# Patient Record
Sex: Female | Born: 1990 | Hispanic: No | Marital: Married | Smoking: Never smoker
Health system: Southern US, Community
[De-identification: ages and names within clinical notes are randomized; demographics above are authoritative.]

## PROBLEM LIST (undated history)

## (undated) ENCOUNTER — Inpatient Hospital Stay (HOSPITAL_COMMUNITY): Payer: Self-pay

## (undated) DIAGNOSIS — Z98891 History of uterine scar from previous surgery: Secondary | ICD-10-CM

## (undated) DIAGNOSIS — Z789 Other specified health status: Secondary | ICD-10-CM

## (undated) DIAGNOSIS — O321XX0 Maternal care for breech presentation, not applicable or unspecified: Principal | ICD-10-CM

## (undated) HISTORY — PX: NO PAST SURGERIES: SHX2092

---

## 2011-11-06 NOTE — L&D Delivery Note (Signed)
Delivery Note At 1:53 PM a viable and healthy female was delivered via spontaneous vaginal delivery (Presentation: ROA).  APGAR: 7,8 ; weight 8lb 5oz .   Placenta status: intact , .  Cord:  with the following complications: none.  Cord pH: n/a  Anesthesia:  None  Episiotomy: None  Lacerations: 1st degree, no repair needed Suture Repair: n/a Est. Blood Loss (mL): 400  Mom to postpartum.  Baby to nursery-stable.  [facility] 02/23/2012, 2:21 PM

## 2012-02-23 ENCOUNTER — Inpatient Hospital Stay (HOSPITAL_COMMUNITY)
Admission: EM | Admit: 2012-02-23 | Discharge: 2012-02-25 | DRG: 775 | Disposition: A | Discharge status: Home / Self Care | Payer: Self-pay | Attending: Obstetrics & Gynecology | Admitting: Obstetrics & Gynecology

## 2012-02-23 ENCOUNTER — Encounter (HOSPITAL_COMMUNITY): Payer: Self-pay

## 2012-02-23 DIAGNOSIS — O0930 Supervision of pregnancy with insufficient antenatal care, unspecified trimester: Secondary | ICD-10-CM

## 2012-02-23 DIAGNOSIS — IMO0001 Reserved for inherently not codable concepts without codable children: Secondary | ICD-10-CM

## 2012-02-23 DIAGNOSIS — O2650 Maternal hypotension syndrome, unspecified trimester: Principal | ICD-10-CM | POA: Diagnosis not present

## 2012-02-23 LAB — URINALYSIS, ROUTINE W REFLEX MICROSCOPIC
Glucose, UA: NEGATIVE mg/dL
Ketones, ur: NEGATIVE mg/dL
Nitrite: NEGATIVE
pH: 7 (ref 5.0–8.0)

## 2012-02-23 LAB — URINE MICROSCOPIC-ADD ON

## 2012-02-23 LAB — DIFFERENTIAL
Basophils Absolute: 0 10*3/uL (ref 0.0–0.1)
Basophils Relative: 0 % (ref 0–1)
Eosinophils Absolute: 0.4 10*3/uL (ref 0.0–0.7)
Monocytes Absolute: 0.4 10*3/uL (ref 0.1–1.0)
Monocytes Relative: 4 % (ref 3–12)
Neutro Abs: 8 10*3/uL — ABNORMAL HIGH (ref 1.7–7.7)
Neutrophils Relative %: 77 % (ref 43–77)

## 2012-02-23 LAB — TYPE AND SCREEN: Antibody Screen: NEGATIVE

## 2012-02-23 LAB — POCT I-STAT, CHEM 8
HCT: 32 % — ABNORMAL LOW (ref 36.0–46.0)
Hemoglobin: 10.9 g/dL — ABNORMAL LOW (ref 12.0–15.0)
Potassium: 3.7 mEq/L (ref 3.5–5.1)
Sodium: 138 mEq/L (ref 135–145)

## 2012-02-23 LAB — CBC
HCT: 29.5 % — ABNORMAL LOW (ref 36.0–46.0)
Hemoglobin: 9.5 g/dL — ABNORMAL LOW (ref 12.0–15.0)
MCHC: 32.2 g/dL (ref 30.0–36.0)
RBC: 3.71 MIL/uL — ABNORMAL LOW (ref 3.87–5.11)
WBC: 10.3 10*3/uL (ref 4.0–10.5)

## 2012-02-23 LAB — RAPID URINE DRUG SCREEN, HOSP PERFORMED
Barbiturates: NOT DETECTED
Benzodiazepines: NOT DETECTED
Cocaine: NOT DETECTED
Tetrahydrocannabinol: NOT DETECTED

## 2012-02-23 LAB — HEPATITIS B SURFACE ANTIGEN: Hepatitis B Surface Ag: NEGATIVE

## 2012-02-23 LAB — ABO/RH: ABO/RH(D): O POS

## 2012-02-23 LAB — RPR: RPR Ser Ql: NONREACTIVE

## 2012-02-23 LAB — OB RESULTS CONSOLE HIV ANTIBODY (ROUTINE TESTING): HIV: NONREACTIVE

## 2012-02-23 MED ORDER — LANOLIN HYDROUS EX OINT: TOPICAL_OINTMENT | CUTANEOUS | Status: DC | PRN

## 2012-02-23 MED ORDER — CITRIC ACID-SODIUM CITRATE 334-500 MG/5ML PO SOLN: 30.0000 mL | ORAL | Status: DC | PRN

## 2012-02-23 MED ORDER — PRENATAL MULTIVITAMIN CH
1.0000 | ORAL_TABLET | Freq: Every day | ORAL | Status: DC
  Administered 2012-02-24 – 2012-02-25 (×2): 1 via ORAL
  Filled 2012-02-23 (×3): qty 1

## 2012-02-23 MED ORDER — DIBUCAINE 1 % RE OINT: 1.0000 "application " | TOPICAL_OINTMENT | RECTAL | Status: DC | PRN

## 2012-02-23 MED ORDER — DIPHENHYDRAMINE HCL 25 MG PO CAPS: 25.0000 mg | ORAL_CAPSULE | Freq: Four times a day (QID) | ORAL | Status: DC | PRN

## 2012-02-23 MED ORDER — LIDOCAINE HCL (PF) 1 % IJ SOLN
30.0000 mL | INTRAMUSCULAR | Status: DC | PRN
  Filled 2012-02-23: qty 30

## 2012-02-23 MED ORDER — IBUPROFEN 600 MG PO TABS
600.0000 mg | ORAL_TABLET | Freq: Four times a day (QID) | ORAL | Status: DC
  Administered 2012-02-23 – 2012-02-25 (×7): 600 mg via ORAL
  Filled 2012-02-23 (×6): qty 1

## 2012-02-23 MED ORDER — TERBUTALINE SULFATE 1 MG/ML IJ SOLN: 0.5000 mg | Freq: Once | INTRAMUSCULAR | Status: DC

## 2012-02-23 MED ORDER — ONDANSETRON HCL 4 MG/2ML IJ SOLN: 4.0000 mg | Freq: Four times a day (QID) | INTRAMUSCULAR | Status: DC | PRN

## 2012-02-23 MED ORDER — IBUPROFEN 600 MG PO TABS
600.0000 mg | ORAL_TABLET | Freq: Four times a day (QID) | ORAL | Status: DC | PRN
  Administered 2012-02-23: 600 mg via ORAL
  Filled 2012-02-23: qty 1

## 2012-02-23 MED ORDER — OXYCODONE-ACETAMINOPHEN 5-325 MG PO TABS
1.0000 | ORAL_TABLET | ORAL | Status: DC | PRN
  Administered 2012-02-24 (×2): 1 via ORAL
  Filled 2012-02-23 (×2): qty 1

## 2012-02-23 MED ORDER — ACETAMINOPHEN 325 MG PO TABS: 650.0000 mg | ORAL_TABLET | ORAL | Status: DC | PRN

## 2012-02-23 MED ORDER — FLEET ENEMA 7-19 GM/118ML RE ENEM: 1.0000 | ENEMA | RECTAL | Status: DC | PRN

## 2012-02-23 MED ORDER — TETANUS-DIPHTH-ACELL PERTUSSIS 5-2.5-18.5 LF-MCG/0.5 IM SUSP: 0.5000 mL | Freq: Once | INTRAMUSCULAR | Status: DC

## 2012-02-23 MED ORDER — BENZOCAINE-MENTHOL 20-0.5 % EX AERO
1.0000 "application " | INHALATION_SPRAY | CUTANEOUS | Status: DC | PRN
  Administered 2012-02-24: 1 via TOPICAL

## 2012-02-23 MED ORDER — LACTATED RINGERS IV SOLN: 500.0000 mL | INTRAVENOUS | Status: DC | PRN

## 2012-02-23 MED ORDER — OXYTOCIN BOLUS FROM INFUSION
500.0000 mL | Freq: Once | INTRAVENOUS | Status: DC
  Filled 2012-02-23: qty 1000
  Filled 2012-02-23: qty 500

## 2012-02-23 MED ORDER — SENNOSIDES-DOCUSATE SODIUM 8.6-50 MG PO TABS
2.0000 | ORAL_TABLET | Freq: Every day | ORAL | Status: DC
  Administered 2012-02-23 – 2012-02-24 (×2): 2 via ORAL

## 2012-02-23 MED ORDER — OXYTOCIN 20 UNITS IN LACTATED RINGERS INFUSION - SIMPLE
125.0000 mL/h | Freq: Once | INTRAVENOUS | Status: AC
  Administered 2012-02-23: 500 mL/h via INTRAVENOUS

## 2012-02-23 MED ORDER — ONDANSETRON HCL 4 MG/2ML IJ SOLN: 4.0000 mg | INTRAMUSCULAR | Status: DC | PRN

## 2012-02-23 MED ORDER — WITCH HAZEL-GLYCERIN EX PADS: 1.0000 "application " | MEDICATED_PAD | CUTANEOUS | Status: DC | PRN

## 2012-02-23 MED ORDER — ZOLPIDEM TARTRATE 5 MG PO TABS: 5.0000 mg | ORAL_TABLET | Freq: Every evening | ORAL | Status: DC | PRN

## 2012-02-23 MED ORDER — SIMETHICONE 80 MG PO CHEW: 80.0000 mg | CHEWABLE_TABLET | ORAL | Status: DC | PRN

## 2012-02-23 MED ORDER — ONDANSETRON HCL 4 MG PO TABS: 4.0000 mg | ORAL_TABLET | ORAL | Status: DC | PRN

## 2012-02-23 MED ORDER — LACTATED RINGERS IV SOLN
INTRAVENOUS | Status: DC
  Administered 2012-02-23: 11:00:00 via INTRAVENOUS

## 2012-02-23 MED ORDER — OXYCODONE-ACETAMINOPHEN 5-325 MG PO TABS
1.0000 | ORAL_TABLET | ORAL | Status: DC | PRN
Start: 2012-02-23 — End: 2012-02-23
  Administered 2012-02-23: 1 via ORAL
  Filled 2012-02-23: qty 1

## 2012-02-23 MED ORDER — SODIUM CHLORIDE 0.9 % IV SOLN
2.0000 g | Freq: Four times a day (QID) | INTRAVENOUS | Status: DC
  Administered 2012-02-23: 2 g via INTRAVENOUS
  Filled 2012-02-23 (×3): qty 2000

## 2012-02-23 NOTE — H&P (Signed)
[name] [name] is a 21 y.o. female presenting for active labor.  Pt transferred from [facility] in active labor.  Contractions began at 0900 today.  Denies vaginal bleeding or leaking of fluid.  Reports no prenatal care and no problems during pregnancy.  Significant other unable to take her to see provider due to his work schedule.  Uncertain LMP.  Reports an uncomplicated first delivery and pregnancy in [place].  Maternal Medical History:  Reason for admission: Reason for admission: contractions.  Contractions: Onset was 3-5 hours ago.   Frequency: regular.    Fetal activity: Perceived fetal activity is normal.   Last perceived fetal movement was within the past hour.    Prenatal complications: no prenatal complications   OB History    Grav Para Term Preterm Abortions TAB SAB Ect Mult Living   2 1 0       1     History reviewed. No pertinent past medical history. History reviewed. No pertinent past surgical history. Family History: family history is not on file. Social History:  reports that she has never smoked. She does not have any smokeless tobacco history on file. She reports that she does not drink alcohol or use illicit drugs.  Review of Systems  Gastrointestinal: Positive for abdominal pain.  All other systems reviewed and are negative.    Dilation: 8 Effacement (%): 0 Exam by:: Katie, RN rapid OB nurse Blood pressure 105/56, pulse 106, temperature 97.9 F (36.6 C), temperature source Oral, resp. rate 18, height 5\' 3"  (1.6 m), weight 70.308 kg (155 lb), SpO2 98.00%. Maternal Exam:  Uterine Assessment: Contraction strength is firm.  Contraction frequency is regular.   Abdomen: Fundal height is 36.   Estimated fetal weight is 6-6.5.   Fetal presentation: no presenting part  Introitus: Normal vulva. Normal vagina.  Vaginal discharge: mucusy.  Pelvis: adequate for delivery.      Physical Exam  Constitutional: She is oriented to person, place, and time. She  appears well-developed and well-nourished. She appears distressed (appears uncomfortable with contractions).  HENT:  Head: Normocephalic.  Neck: Normal range of motion. Neck supple.  Cardiovascular: Normal rate, regular rhythm and normal heart sounds.   Respiratory: Effort normal and breath sounds normal.  GI: Soft. There is no tenderness.  Genitourinary: No bleeding around the vagina. Vaginal discharge: mucusy.  Neurological: She is alert and oriented to person, place, and time.  Skin: Skin is warm and dry.    Prenatal labs: ABO, Rh:   Antibody:   Rubella:   RPR:    HBsAg:    HIV:    GBS:     Assessment/Plan: Active Labor No Prenatal Care GBS unknown Category I FHR  Plan: Admit to birthing suites Ampicillin 2 gm IV Obtain prenatal panel + UDS Anticipate NSVD  [facility] 02/23/2012, 11:40 AM

## 2012-02-23 NOTE — ED Notes (Signed)
Pt sts she started having contractions on Friday, she did have some fluid leakage earlier before coming in. Pt sts this is her 2nd child. Pt does not speak very good [place].

## 2012-02-23 NOTE — Progress Notes (Signed)
Pt while in the bathroom, stood up from toilet and then was eased to the floor by this RN as the patient passed out.  Patient did not appear to hit any part of her body.  Husband came immediately after being asked to hit emergency button.  Amonia used to help patient revive.  Patient then transferred to a wheel chair.  Vital signs were retaken, no significant difference.  Juice given and patient put in bed supine with husband at bedside.  No increase in bleeding - which remains small in amount.  No further change in mentation, neuro status, vs, etc.

## 2012-02-23 NOTE — ED Notes (Signed)
[facility] at the bedside. Report called to Elnita Maxwell, RN at [facility] hospital. Accepting physician Dr. Marice Potter.

## 2012-02-23 NOTE — ED Notes (Signed)
Pharmacy unable to send medication at the time, Katie RN with OB rapid response at the bedside. [facility]

## 2012-02-23 NOTE — ED Provider Notes (Signed)
History     CSN: [id number]  Arrival date & time 02/23/12  1041   First MD Initiated Contact with Patient 02/23/12 1056      Chief Complaint  Patient presents with  . Contractions    (Consider location/radiation/quality/duration/timing/severity/associated sxs/prior treatment) HPI  [name] [name] G2P1 presents in active labor. Pt recently moved here. Husband states she is without OBGYN care throughout pregnancy. Unk how far along she is. She reports small trickle of fluid and abdominal pain/contractions 5 minutes apart. Feels like she wants to push. Denies bleeding. Denies trauma.   Language barrier. Interpreted through husband temporarily   History reviewed. No pertinent past medical history.  History reviewed. No pertinent past surgical history.  No family history on file.  History  Substance Use Topics  . Smoking status: Never Smoker   . Smokeless tobacco: Not on file  . Alcohol Use: No    OB History    Grav Para Term Preterm Abortions TAB SAB Ect Mult Living   2 1 0       1      Review of Systems  All other systems reviewed and are negative.   except as noted HPI   Allergies  Review of patient's allergies indicates no known allergies.  Home Medications  No current outpatient prescriptions on file.  BP 105/56  Pulse 106  Temp(Src) 97.9 F (36.6 C) (Oral)  Resp 18  Ht 5\' 3"  (1.6 m)  Wt 155 lb (70.308 kg)  BMI 27.46 kg/m2  SpO2 98%  Physical Exam  Nursing note and vitals reviewed. Constitutional: She is oriented to person, place, and time. She appears well-developed.  HENT:  Head: Atraumatic.  Mouth/Throat: Oropharynx is clear and moist.  Eyes: Conjunctivae and EOM are normal. Pupils are equal, round, and reactive to light.  Neck: Normal range of motion. Neck supple.  Cardiovascular: Normal rate, regular rhythm, normal heart sounds and intact distal pulses.   Pulmonary/Chest: Effort normal and breath sounds normal. No respiratory distress. She has no  wheezes. She has no rales.  Abdominal: Soft. She exhibits no distension. There is no tenderness. There is no rebound and no guarding.       Gravid uterus FHR 150s Active contractions noted on monitor  Genitourinary:       8cm dilated 0 station  (bulging bag per OB rapid response nurse)    Musculoskeletal: Normal range of motion.  Neurological: She is alert and oriented to person, place, and time.  Skin: Skin is warm and dry. No rash noted.  Psychiatric: She has a normal mood and affect.    ED Course  Procedures (including critical care time)  Labs Reviewed  POCT I-STAT, CHEM 8 - Abnormal; Notable for the following:    BUN 5 (*)    Calcium, Ion 1.09 (*)    Hemoglobin 10.9 (*)    HCT 32.0 (*)    All other components within normal limits  CBC  RPR  URINALYSIS, ROUTINE W REFLEX MICROSCOPIC  HEPATITIS B SURFACE ANTIGEN  RUBELLA SCREEN  DIFFERENTIAL  TYPE AND SCREEN  RAPID HIV SCREEN ([facility])  URINE RAPID DRUG SCREEN (HOSP PERFORMED)   No results found.   1. Active labor     MDM  Active labor at 0 station with bulging bag. OB rapid response nurse d/w Dr. Marice Potter. 0.5cm terbutaline subcut ordered. [facility] on standby for transfer to [facility].         Forbes Cellar, MD 02/23/12 [phone]

## 2012-02-24 MED ORDER — BENZOCAINE-MENTHOL 20-0.5 % EX AERO
INHALATION_SPRAY | CUTANEOUS | Status: AC
  Filled 2012-02-24: qty 56

## 2012-02-24 NOTE — Progress Notes (Signed)
Post Partum Day 1 Subjective: no complaints, up ad lib, voiding and tolerating PO; breastfeeding, declines family planning.    Objective: Blood pressure 98/62, pulse 60, temperature 98.6 F (37 C), temperature source Oral, resp. rate 18, height 5\' 3"  (1.6 m), weight 70.308 kg (155 lb), SpO2 98.00%, unknown if currently breastfeeding.  Physical Exam:  General: alert, cooperative and appears stated age Lochia: appropriate Uterine Fundus: firm Incision: n/a DVT Evaluation: No evidence of DVT seen on physical exam. Negative Homan's sign.   Basename 02/23/12 1139 02/23/12 1107  HGB 9.5* 10.9*  HCT 29.5* 32.0*    Assessment/Plan: Plan for discharge tomorrow and Contraception declines.   LOS: 1 day   [facility] 02/24/2012, 7:21 AM

## 2012-02-24 NOTE — Clinical Social Work Maternal (Signed)
Clinical Social Work Department PSYCHOSOCIAL ASSESSMENT - MATERNAL/CHILD 02/24/2012  Patient:  [name] [name]  Account Number:  [id number]  Admit Date:  02/23/2012  Childs Name:   [name] [name]    Clinical Social Worker:  Lylee Corrow, LCSW   Date/Time:  02/24/2012 03:00 PM  Date Referred:  02/24/2012   Referral source  Central Nursery     Referred reason  LPNC   Other referral source:    I:  FAMILY / HOME ENVIRONMENT Child's legal guardian:  PARENT  Guardian - Name Guardian - Age Guardian - Address  [name] [name] 20 [address], [place], [place] [address]   Other household support members/support persons Name Relationship DOB  [name] [name] FATHER    Other support:   Pt and FOB live with his cousin and wife.    II  PSYCHOSOCIAL DATA Information Source:  Family Interview  Financial and Community Resources Employment:   Pt not employeed.   Financial resources:  Self Pay If Medicaid - County:    School / Grade:   Maternity Care Coordinator / Child Services Coordination / Early Interventions:  Cultural issues impacting care:   Mother and father of baby are from near [place].  Mother speaks some English, but relies on father who is fluent.    III  STRENGTHS Strengths  Adequate Resources  Home prepared for Child (including basic supplies)  Supportive family/friends   Strength comment:  FOB is employeed and has been working at obtaining necessary supplies and support for his family.  He appeared very attentive to both the baby and MOB.   IV  RISK FACTORS AND CURRENT PROBLEMS Current Problem:  None   Risk Factor & Current Problem Patient Issue Family Issue Risk Factor / Current Problem Comment   N N     V  SOCIAL WORK ASSESSMENT Received referral due to no prenatal care due to insurance. Pt and FOB were present during visit.  They are from near [place].  She speaks some English, but relies FOB who is fluent.  Informed them of drug screen due to prenatal care issues  and that it was negative.  MEC pending.  They said they understood.    They appear to be adjusting to their move to [place].  He had the car seat in the room and stated they have diapers, food, etc. for the baby.  His cousin and wife currently live with them and have been supportive.  He answered questions appropriately, but was hesitant to engage in lengthy conversation.  They will follow up with DSS for Medicaid.  He said they would not be eligible for WIC or Food Stamps.      VI SOCIAL WORK PLAN Social Work Plan  No Further Intervention Required / No Barriers to Discharge   Type of pt/family education:   Medicaid application provided to family.  Informed them to go to DSS tomorrow to apply.   If child protective services report - county:   If child protective services report - date:   Information/referral to community resources comment:   Other social work plan:      

## 2012-02-25 MED ORDER — IBUPROFEN 600 MG PO TABS: 600.0000 mg | ORAL_TABLET | Freq: Four times a day (QID) | ORAL | Status: AC

## 2012-02-25 NOTE — Progress Notes (Signed)
Attempted to use [name] [name] Interpreter to assist with discharge instructions, since [place] proficiency of both patient and her husband was difficult to determine during the morning.  Husband insisted that he understood everything and that he would interpret for his wife.  He stated that they spoke "[place]" (unsure of spelling), a language of [place].  Pt nodded her head in agreement with husband's assertion that he would interpret.  Provided discharge instructions per usual due to husband's insistence.

## 2012-02-25 NOTE — Discharge Instructions (Signed)
Vaginal Delivery Care After  Change your pad on each trip to the bathroom.   Wipe gently with toilet paper during your hospital stay. Always wipe from front to back. A spray bottle with warm tap water could also be used or a towelette if available.   Place your soiled pad and toilet paper in a bathroom wastebasket with a plastic bag liner.   During your hospital stay, save any clots. If you pass a clot while on the toilet, do not flush it. Also, if your vaginal flow seems excessive to you, notify nursing personnel.   The first time you get out of bed after delivery, wait for assistance from a nurse. Do not get up alone at any time if you feel weak or dizzy.   Bend and extend your ankles forcefully so that you feel the calves of your legs get hard. Do this 6 times every hour when you are in bed and awake.   Do not sit with one foot under you, dangle your legs over the edge of the bed, or maintain a position that hinders the circulation in your legs.   Many women experience after pains for 2 to 3 days after delivery. These after pains are mild uterine contractions. Ask the nurse for a pain medication if you need something for this. Sometimes breastfeeding stimulates after pains; if you find this to be true, ask for the medication  -  hour before the next feeding.   For you and your infant's protection, do not go beyond the door(s) of the obstetric unit. Do not carry your baby in your arms in the hallway. When taking your baby to and from your room, put your baby in the bassinet and push the bassinet.   Mothers may have their babies in their room as much as they desire.  Document Released: 10/19/2000 Document Revised: 10/11/2011 Document Reviewed: 09/19/2007 ExitCare Patient Information 2012 [employer]. 

## 2012-02-25 NOTE — Progress Notes (Signed)
UR chart review completed.  

## 2012-02-25 NOTE — Discharge Summary (Signed)
Obstetric Discharge Summary Reason for Admission: onset of labor Prenatal Procedures: none Intrapartum Procedures: spontaneous vaginal delivery Postpartum Procedures: none Complications-Operative and Postpartum: none Hemoglobin  Date Value Range Status  02/23/2012 9.5* 12.0-15.0 (g/dL) Final     HCT  Date Value Range Status  02/23/2012 29.5* 36.0-46.0 (%) Final    Physical Exam:  General: alert, cooperative, appears stated age and no distress Lochia: appropriate Uterine Fundus: firm Incision: DVT Evaluation: No evidence of DVT seen on physical exam. Negative Homan's sign. No cords or calf tenderness. No significant calf/ankle edema.  Discharge Diagnoses: Term Pregnancy-delivered  Discharge Information: Date: 02/25/2012 Activity: pelvic rest Diet: routine Medications: Ibuprofen Condition: stable and improved Instructions: refer to practice specific booklet Discharge to: home Follow-up Information    Follow up with [facility] [place] in 6 weeks.   Contact information:   [address] [employer] [place] [address]          Newborn Data: Live born female  Birth Weight: 8 lb 5 oz (3771 g) APGAR: 7, 8  Home with mother.  [name] [name] [name] [name] 02/25/2012, 6:33 AM

## 2012-02-25 NOTE — Progress Notes (Signed)
Post Partum Day 2 Subjective: no complaints, up ad lib, voiding, tolerating PO and + flatus  Objective: Blood pressure 100/58, pulse 65, temperature 98 F (36.7 C), temperature source Oral, resp. rate 18, height 5\' 3"  (1.6 m), weight 155 lb (70.308 kg), SpO2 98.00%, unknown if currently breastfeeding.  Physical Exam:  General: alert, cooperative, appears stated age and no distress Lochia: appropriate Uterine Fundus: firm Incision: n/a DVT Evaluation: No evidence of DVT seen on physical exam. Negative Homan's sign. No cords or calf tenderness. No significant calf/ankle edema. Positive Homan's sign.   Basename 02/23/12 1139 02/23/12 1107  HGB 9.5* 10.9*  HCT 29.5* 32.0*    Assessment/Plan: Discharge home and Contraception declines contraception   LOS: 2 days   [name] [name] 02/25/2012, 6:29 AM

## 2013-10-25 ENCOUNTER — Emergency Department (HOSPITAL_COMMUNITY): Payer: Medicaid Other

## 2013-10-25 ENCOUNTER — Encounter (HOSPITAL_COMMUNITY): Payer: Self-pay | Admitting: Emergency Medicine

## 2013-10-25 ENCOUNTER — Emergency Department (HOSPITAL_COMMUNITY)
Admission: EM | Admit: 2013-10-25 | Discharge: 2013-10-25 | Disposition: A | Discharge status: Home / Self Care | Payer: Medicaid Other | Attending: Emergency Medicine | Admitting: Emergency Medicine

## 2013-10-25 DIAGNOSIS — N760 Acute vaginitis: Secondary | ICD-10-CM | POA: Insufficient documentation

## 2013-10-25 DIAGNOSIS — A499 Bacterial infection, unspecified: Secondary | ICD-10-CM | POA: Insufficient documentation

## 2013-10-25 DIAGNOSIS — B9689 Other specified bacterial agents as the cause of diseases classified elsewhere: Secondary | ICD-10-CM | POA: Insufficient documentation

## 2013-10-25 DIAGNOSIS — O2390 Unspecified genitourinary tract infection in pregnancy, unspecified trimester: Secondary | ICD-10-CM | POA: Insufficient documentation

## 2013-10-25 DIAGNOSIS — N390 Urinary tract infection, site not specified: Secondary | ICD-10-CM

## 2013-10-25 DIAGNOSIS — Z79899 Other long term (current) drug therapy: Secondary | ICD-10-CM | POA: Insufficient documentation

## 2013-10-25 DIAGNOSIS — O200 Threatened abortion: Secondary | ICD-10-CM | POA: Insufficient documentation

## 2013-10-25 DIAGNOSIS — A599 Trichomoniasis, unspecified: Secondary | ICD-10-CM | POA: Insufficient documentation

## 2013-10-25 LAB — URINE MICROSCOPIC-ADD ON

## 2013-10-25 LAB — URINALYSIS, ROUTINE W REFLEX MICROSCOPIC
Bilirubin Urine: NEGATIVE
Glucose, UA: NEGATIVE mg/dL
Ketones, ur: NEGATIVE mg/dL
Nitrite: NEGATIVE
Protein, ur: NEGATIVE mg/dL
Specific Gravity, Urine: 1.003 — ABNORMAL LOW (ref 1.005–1.030)
Urobilinogen, UA: 0.2 mg/dL (ref 0.0–1.0)
pH: 7 (ref 5.0–8.0)

## 2013-10-25 LAB — WET PREP, GENITAL
Trich, Wet Prep: NONE SEEN
Yeast Wet Prep HPF POC: NONE SEEN

## 2013-10-25 MED ORDER — CEPHALEXIN 500 MG PO CAPS: 500.0000 mg | ORAL_CAPSULE | Freq: Three times a day (TID) | ORAL | Status: DC

## 2013-10-25 MED ORDER — CEPHALEXIN 250 MG PO CAPS
250.0000 mg | ORAL_CAPSULE | Freq: Once | ORAL | Status: AC
  Administered 2013-10-25: 250 mg via ORAL
  Filled 2013-10-25: qty 1

## 2013-10-25 MED ORDER — METRONIDAZOLE 0.75 % VA GEL: 1.0000 | Freq: Two times a day (BID) | VAGINAL | Status: DC

## 2013-10-25 NOTE — ED Provider Notes (Signed)
CSN: [id number]     Arrival date & time 10/25/13  1725 History   First MD Initiated Contact with Patient 10/25/13 1733     Chief Complaint  Patient presents with  . Vaginal Bleeding   (Consider location/radiation/quality/duration/timing/severity/associated sxs/prior Treatment) HPI  Sister in law interprets  Patient is G3 P2 Ab0. She does not recall her last normal period but states she's about [redacted] weeks pregnant. She had a positive pregnancy test at the health department. She is waiting for her first prenatal visit. She's had some minor spotting during this pregnancy. She states at 8 AM she got up and she was fine. At 10 AM she got up out of bed and she had some bleeding. She's continued to have bleeding throughout the day. He cannot tell me if it's heavier or the same as her regular period. She denies any abdominal discomfort. She denies nausea, vomiting, fever. She has bilateral lower back pain. She states she had dysuria yesterday without frequency. She has had two normal full term pregnancies and deliveries.    OB will be [facility]  History reviewed. No pertinent past medical history. History reviewed. No pertinent past surgical history. History reviewed. No pertinent family history. History  Substance Use Topics  . Smoking status: Never Smoker   . Smokeless tobacco: Never Used  . Alcohol Use: No   Lives at home Lives with spouse Unemployed From [place]   [place] History   Grav Para Term Preterm Abortions TAB SAB Ect Mult Living   3 2 1       2      Review of Systems  All other systems reviewed and are negative.    Allergies  Review of patient's allergies indicates no known allergies.  Home Medications   Current Outpatient Rx  Name  Route  Sig  Dispense  Refill  . Prenatal Vit-Fe Fumarate-FA (MULTIVITAMIN-PRENATAL) 27-0.8 MG TABS tablet   Oral   Take 1 tablet by mouth daily at 12 noon.          BP 115/66  Pulse 97  Temp(Src) 97.6 F (36.4 C) (Oral)  Resp  14  SpO2 97%  Vital signs normal   Physical Exam  Nursing note and vitals reviewed. Constitutional: She is oriented to person, place, and time. She appears well-developed and well-nourished.  Non-toxic appearance. She does not appear ill. No distress.  HENT:  Head: Normocephalic and atraumatic.  Right Ear: External ear normal.  Left Ear: External ear normal.  Nose: Nose normal. No mucosal edema or rhinorrhea.  Mouth/Throat: Oropharynx is clear and moist and mucous membranes are normal. No dental abscesses or uvula swelling.  Eyes: Conjunctivae and EOM are normal. Pupils are equal, round, and reactive to light.  Neck: Normal range of motion and full passive range of motion without pain. Neck supple.  Cardiovascular: Normal rate, regular rhythm and normal heart sounds.  Exam reveals no gallop and no friction rub.   No murmur heard. Pulmonary/Chest: Effort normal and breath sounds normal. No respiratory distress. She has no wheezes. She has no rhonchi. She has no rales. She exhibits no tenderness and no crepitus.  Abdominal: Soft. Normal appearance and bowel sounds are normal. She exhibits no distension. There is tenderness. There is no rebound and no guarding.    Genitourinary:  Normal external genitalia. Small amount of dark blood in the vault. Her uterus does not feel enlarged. She's tender diffusely over her uterus and both adnexa are without masses.Cx feels closed.   Musculoskeletal: Normal  range of motion. She exhibits no edema and no tenderness.  Moves all extremities well.   Neurological: She is alert and oriented to person, place, and time. She has normal strength. No cranial nerve deficit.  Skin: Skin is warm, dry and intact. No rash noted. No erythema. No pallor.  Psychiatric: She has a normal mood and affect. Her speech is normal and behavior is normal. Her mood appears not anxious.    ED Course  Procedures (including critical care time)   Review of prior records shows she  was O+ in 2013.  Pt given results of her [place] and her labs. She is strongly advised to get her prenatal care started.   Labs Review Results for orders placed during the hospital encounter of 10/25/13  WET PREP, GENITAL      Result Value Range   Yeast Wet Prep HPF POC NONE SEEN  NONE SEEN   Trich, Wet Prep NONE SEEN  NONE SEEN   Clue Cells Wet Prep HPF POC FEW (*) NONE SEEN   WBC, Wet Prep HPF POC FEW (*) NONE SEEN  URINALYSIS, ROUTINE W REFLEX MICROSCOPIC      Result Value Range   Color, Urine YELLOW  YELLOW   APPearance CLOUDY (*) CLEAR   Specific Gravity, Urine 1.003 (*) 1.005 - 1.030   pH 7.0  5.0 - 8.0   Glucose, UA NEGATIVE  NEGATIVE mg/dL   Hgb urine dipstick LARGE (*) NEGATIVE   Bilirubin Urine NEGATIVE  NEGATIVE   Ketones, ur NEGATIVE  NEGATIVE mg/dL   Protein, ur NEGATIVE  NEGATIVE mg/dL   Urobilinogen, UA 0.2  0.0 - 1.0 mg/dL   Nitrite NEGATIVE  NEGATIVE   Leukocytes, UA LARGE (*) NEGATIVE  URINE MICROSCOPIC-ADD ON      Result Value Range   Squamous Epithelial / LPF RARE  RARE   WBC, UA 11-20  <3 WBC/hpf   RBC / HPF 3-6  <3 RBC/hpf   Bacteria, UA FEW (*) RARE   Urine-Other TRICHOMONAS PRESENT     Laboratory interpretation all normal except bacterial vaginosis, UTI   Imaging Review US Ob Limited  10/25/2013   CLINICAL DATA:  Vaginal bleeding.  EXAM: LIMITED OBSTETRIC ULTRASOUND  FINDINGS: Number of Fetuses: 1  Heart Rate:  165 bpm  Movement: Yes  Presentation: Cephalic  Placental Location: Anterior  Previa: No  Amniotic Fluid (Subjective):  Within normal limits.  BPD:  3.56cm 17w  0d  MATERNAL FINDINGS:  Cervix:  Appears closed.  Uterus/Adnexae:  No abnormality visualized.  IMPRESSION: Negative exam.  This exam is performed on an emergent basis and does not comprehensively evaluate fetal size, dating, or anatomy; follow-up complete OB US should be considered if further fetal assessment is warranted.   Electronically Signed   By: Drusilla Kanner M.D.   On: 10/25/2013  19:35    EKG Interpretation   None       MDM   1. Threatened miscarriage   2. UTI (urinary tract infection)   3. BV (bacterial vaginosis)   4. Trichomoniasis       Discharge Medication List as of 10/25/2013  8:44 PM    START taking these medications   Details  metroNIDAZOLE (METROGEL VAGINAL) 0.75 % vaginal gel Place 1 Applicatorful vaginally 2 (two) times daily., Starting 10/25/2013, Until Discontinued, Print    cephALEXin (KEFLEX) 500 MG capsule Take 1 capsule (500 mg total) by mouth 3 (three) times daily., Starting 10/25/2013, Until Discontinued, Print  Plan discharge  Devoria Albe, MD, Franz Dell, MD 10/26/13 [phone]

## 2013-10-25 NOTE — ED Notes (Signed)
Unable to obtain fetal heart tones. MD made aware.

## 2013-10-25 NOTE — ED Notes (Signed)
She c/o awakening with "a lot of blood in my bed".  She states she is ~[redacted]weeks gestation.  She states she is completely pain-free and in no distress.  She has an adult female with her who is acting as a capable Nurse, learning disability.  Pt. Is [place] and is healthy in appearance.

## 2013-10-26 ENCOUNTER — Encounter (HOSPITAL_COMMUNITY): Payer: Self-pay | Admitting: Emergency Medicine

## 2013-10-26 ENCOUNTER — Emergency Department (HOSPITAL_COMMUNITY)
Admission: EM | Admit: 2013-10-26 | Discharge: 2013-10-26 | Disposition: A | Discharge status: Home / Self Care | Payer: Medicaid Other | Attending: Emergency Medicine | Admitting: Emergency Medicine

## 2013-10-26 DIAGNOSIS — Z3490 Encounter for supervision of normal pregnancy, unspecified, unspecified trimester: Secondary | ICD-10-CM

## 2013-10-26 DIAGNOSIS — R300 Dysuria: Secondary | ICD-10-CM | POA: Insufficient documentation

## 2013-10-26 DIAGNOSIS — R109 Unspecified abdominal pain: Secondary | ICD-10-CM | POA: Insufficient documentation

## 2013-10-26 DIAGNOSIS — Z79899 Other long term (current) drug therapy: Secondary | ICD-10-CM | POA: Insufficient documentation

## 2013-10-26 DIAGNOSIS — R339 Retention of urine, unspecified: Secondary | ICD-10-CM | POA: Insufficient documentation

## 2013-10-26 DIAGNOSIS — O9989 Other specified diseases and conditions complicating pregnancy, childbirth and the puerperium: Secondary | ICD-10-CM | POA: Insufficient documentation

## 2013-10-26 LAB — URINE MICROSCOPIC-ADD ON

## 2013-10-26 LAB — URINALYSIS, ROUTINE W REFLEX MICROSCOPIC
Ketones, ur: NEGATIVE mg/dL
Nitrite: POSITIVE — AB
Protein, ur: NEGATIVE mg/dL

## 2013-10-26 LAB — GC/CHLAMYDIA PROBE AMP: GC Probe RNA: NEGATIVE

## 2013-10-26 NOTE — ED Notes (Signed)
Pt seen here yesterday for TAB, pt return for increase in pain and continued bleeding. Pt states she feels as if she has to urine but can not

## 2013-10-27 LAB — URINE CULTURE: Colony Count: >=100000

## 2013-10-28 NOTE — ED Provider Notes (Signed)
CSN: [id number]     Arrival date & time 10/26/13  0234 History   First MD Initiated Contact with Patient 10/26/13 [phone]     Chief Complaint  Patient presents with  . Urinary Retention  . Abdominal Pain   (Consider location/radiation/quality/duration/timing/severity/associated sxs/prior Treatment) HPI Comments: 22 yo female G3 P2 presents with urinary retention, currently [redacted] wks pregnant.  Pt has not had prenatal care at this point. Was seen in the ED recently for mild spotting and fup discussed.  Pt now is unable to urinate since earlier today.  No hx of similar.  Mild dysuria. No leg weakness, back surgery, back pain or numbness.   Patient is a 22 y.o. female presenting with abdominal pain. The history is provided by the patient and a relative.  Abdominal Pain Associated symptoms: dysuria   Associated symptoms: no chest pain, no chills, no fever, no shortness of breath, no vaginal discharge and no vomiting     History reviewed. No pertinent past medical history. History reviewed. No pertinent past surgical history. No family history on file. History  Substance Use Topics  . Smoking status: Never Smoker   . Smokeless tobacco: Never Used  . Alcohol Use: No   OB History   Grav Para Term Preterm Abortions TAB SAB Ect Mult Living   3 2 1       2      Review of Systems  Constitutional: Negative for fever and chills.  HENT: Negative for congestion.   Eyes: Negative for visual disturbance.  Respiratory: Negative for shortness of breath.   Cardiovascular: Negative for chest pain.  Gastrointestinal: Positive for abdominal pain. Negative for vomiting.  Genitourinary: Positive for dysuria and difficulty urinating. Negative for flank pain and vaginal discharge.  Musculoskeletal: Negative for back pain, neck pain and neck stiffness.  Skin: Negative for rash.  Neurological: Negative for light-headedness and headaches.    Allergies  Review of patient's allergies indicates no known  allergies.  Home Medications   Current Outpatient Rx  Name  Route  Sig  Dispense  Refill  . Prenatal Vit-Fe Fumarate-FA (MULTIVITAMIN-PRENATAL) 27-0.8 MG TABS tablet   Oral   Take 1 tablet by mouth daily at 12 noon.         . cephALEXin (KEFLEX) 500 MG capsule   Oral   Take 1 capsule (500 mg total) by mouth 3 (three) times daily.   21 capsule   0   . metroNIDAZOLE (METROGEL VAGINAL) 0.75 % vaginal gel   Vaginal   Place 1 Applicatorful vaginally 2 (two) times daily.   70 g   0    BP 125/73  Pulse 98  Temp(Src) 97.5 F (36.4 C) (Oral)  Resp 20  SpO2 100% Physical Exam  Nursing note and vitals reviewed. Constitutional: She is oriented to person, place, and time. She appears well-developed and well-nourished.  HENT:  Head: Normocephalic and atraumatic.  Eyes: Conjunctivae are normal. Right eye exhibits no discharge. Left eye exhibits no discharge.  Neck: Normal range of motion. Neck supple. No tracheal deviation present.  Cardiovascular: Normal rate and regular rhythm.   Pulmonary/Chest: Effort normal and breath sounds normal.  Abdominal: Soft. She exhibits no distension. There is tenderness (mild suprapubic). There is no guarding.  Musculoskeletal: She exhibits no edema.  Neurological: She is alert and oriented to person, place, and time. She has normal strength. No sensory deficit.  Reflex Scores:      Patellar reflexes are 2+ on the right side and 2+ on the  left side.      Achilles reflexes are 2+ on the right side and 2+ on the left side. Skin: Skin is warm. No rash noted.  Psychiatric: She has a normal mood and affect.    ED Course  Procedures (including critical care time) EMERGENCY DEPARTMENT [place] PREGNANCY "Study: Limited Ultrasound of the Pelvis for Pregnancy"  INDICATIONS:Pregnancy(required) and Vaginal bleeding Multiple views of the uterus and pelvic cavity were obtained in real-time with a multi-frequency probe.  APPROACH:Transabdominal   PERFORMED  BY: Myself  IMAGES ARCHIVED?: Yes  LIMITATIONS: none  PREGNANCY FREE FLUID: None   PREGNANCY FINDINGS: Intrauterine gestational sac noted and Fetal heart activity seen  INTERPRETATION: Viable intrauterine pregnancy  FETAL HEART RATE: 150s  Limited Ultrasound of bladder  Performed by Dr. Jodi Mourning Indication: to assess for urinary retention  Technique:  Low frequency probe utilized in two planes to assess bladder volume in real-time. Findings: Bladder volume > 500 cc Images were archived electronically  Labs Review Labs Reviewed  URINALYSIS, ROUTINE W REFLEX MICROSCOPIC - Abnormal; Notable for the following:    Specific Gravity, Urine 1.002 (*)    Hgb urine dipstick LARGE (*)    Nitrite POSITIVE (*)    Leukocytes, UA LARGE (*)    All other components within normal limits  URINE MICROSCOPIC-ADD ON   Imaging Review No results found.  EKG Interpretation   None       MDM   1. Urinary retention   2. Pregnancy    Neuro intact.   Pt on abx for UTI and vaginal infection.  No flank pain, well appearing, no fevers. Vag bleeding mild, similar to her previous visit. O pos blood, reviewed previous note Bedside US showed significant urinary retention > 500 ml and bilateral hydronephrosis.  Pregnancy viable on [place].   ? More than expected for normal Pregnancy vs UTI/ dysuria. Spoke with urology on call, agreed with concerns and rec foley and will follow closely in office.   Pt on keflex for UTI, will continue.  Results and differential diagnosis were discussed with the patient. Close follow up outpatient was discussed, patient comfortable with the plan.   Diagnosis: above   Enid Skeens, MD 10/28/13 [phone]

## 2013-10-30 ENCOUNTER — Emergency Department (HOSPITAL_COMMUNITY)
Admission: EM | Admit: 2013-10-30 | Discharge: 2013-10-30 | Disposition: A | Discharge status: Home / Self Care | Payer: Medicaid Other | Attending: Emergency Medicine | Admitting: Emergency Medicine

## 2013-10-30 ENCOUNTER — Encounter (HOSPITAL_COMMUNITY): Payer: Self-pay | Admitting: Emergency Medicine

## 2013-10-30 DIAGNOSIS — E876 Hypokalemia: Secondary | ICD-10-CM | POA: Insufficient documentation

## 2013-10-30 DIAGNOSIS — Z87898 Personal history of other specified conditions: Secondary | ICD-10-CM

## 2013-10-30 DIAGNOSIS — N390 Urinary tract infection, site not specified: Secondary | ICD-10-CM

## 2013-10-30 DIAGNOSIS — O9989 Other specified diseases and conditions complicating pregnancy, childbirth and the puerperium: Secondary | ICD-10-CM | POA: Insufficient documentation

## 2013-10-30 DIAGNOSIS — O2390 Unspecified genitourinary tract infection in pregnancy, unspecified trimester: Secondary | ICD-10-CM | POA: Insufficient documentation

## 2013-10-30 DIAGNOSIS — Z792 Long term (current) use of antibiotics: Secondary | ICD-10-CM | POA: Insufficient documentation

## 2013-10-30 DIAGNOSIS — Z79899 Other long term (current) drug therapy: Secondary | ICD-10-CM | POA: Insufficient documentation

## 2013-10-30 LAB — BASIC METABOLIC PANEL
BUN: 4 mg/dL — ABNORMAL LOW (ref 6–23)
CO2: 21 mEq/L (ref 19–32)
Calcium: 9.1 mg/dL (ref 8.4–10.5)
Chloride: 97 mEq/L (ref 96–112)
Creatinine, Ser: 0.39 mg/dL — ABNORMAL LOW (ref 0.50–1.10)
Glucose, Bld: 113 mg/dL — ABNORMAL HIGH (ref 70–99)

## 2013-10-30 LAB — URINALYSIS W MICROSCOPIC + REFLEX CULTURE
Glucose, UA: NEGATIVE mg/dL
Ketones, ur: NEGATIVE mg/dL
Protein, ur: NEGATIVE mg/dL
Specific Gravity, Urine: 1.004 — ABNORMAL LOW (ref 1.005–1.030)
pH: 7 (ref 5.0–8.0)

## 2013-10-30 NOTE — ED Provider Notes (Deleted)
Medical screening examination/treatment/procedure(s) were conducted as a shared visit with non-physician practitioner(s) and myself.  I personally evaluated the patient during the encounter.  I interviewed and examined the patient. Lungs are CTAB. Cardiac exam wnl. Abdomen soft and gravid. Pt is ambulatory w/ normal strength in her LE's, normal sensation throughout, and 2+ reflexes in the bilateral LE's.    22 yo female G3 P2 presents with urinary retention, currently [redacted] wks pregnant. Pt has not had prenatal care at this point. Was seen in the ED recently for mild spotting, UTI, BV trichomonas. Was seen again 2 days ago for urinary retention. Bedside US showed bilateral hydronephrosis and urinary retention w/ >500cc. Urology was called and close f/u recommended. The pt presents today w/ request to remove foley. She denies any abd pain, vaginal bleeding, fever. She has been well otherwise. She states that she called Uro but was unable to f/u d/t $200 requirement prior to being evaluated. She is currently on keflex for UTI and flagyl for BV. Will remove foley and see if pt can urinate. Pt initially urinated and bladder scan 20 min later showed 200cc. She had to urinate again w/ post void residual showing 85cc.  The pt would like removal of the foley. I think this is reasonable given her well appearance and ability to urinate here. Her sx may be assoc w/ a retroverted uterus assoc w/ the normal changes of pregnancy. I doubt her sx are associated w/ a spinal cord lesion such as transverse myelitis, MS, cauda equina, etc. Pt has non-contrib bmp here w/ normal Cr. UA still suspicious for UTI. Will have her continue the keflex/flagyl. I gave her and her mother strong return precautions to return for any abd pain, fever, weakness, or inability to void. Will rec she f/u w/ obgyn for evaluation. She understands.       Junius Argyle, MD 10/31/13 [phone]

## 2013-10-30 NOTE — ED Notes (Signed)
Pt post void residual was 85cc, MD notified

## 2013-10-30 NOTE — Progress Notes (Signed)
   CARE MANAGEMENT ED NOTE 10/30/2013  Patient:  ZAKIYYAH, SAVANNAH   Account Number:  [id number]  Date Initiated:  10/30/2013  Documentation initiated by:  Radford Pax  Subjective/Objective Assessment:   Patient presents to ED with request to remove foley catheter.     Subjective/Objective Assessment Detail:     Action/Plan:   Action/Plan Detail:   Anticipated DC Date:       Status Recommendation to Physician:   Result of Recommendation:    Other ED Services  Consult Working Plan    DC Planning Services  Other  PCP issues    Choice offered to / List presented to:            Status of service:  Completed, signed off  ED Comments:   ED Comments Detail:  Patient confirms she does not have a pcp, patient has [employer].  [facility] provided patient with a list of pcps who accept self pay patients, list of discount pharmacies and website needymeds.org for medication assistance, EDCM also provided patient with discount RX card, provided patient with list of financial resources in the community such as local churches and [employer], urban ministries, and dental assistance for patients without insurance.  Patient and patient's family member thankful for resources.  No further CM needs at this time.

## 2013-10-30 NOTE — ED Notes (Signed)
Foley catheter removed per order, patient tolerated well.

## 2013-10-30 NOTE — ED Notes (Signed)
Pt post void residual is 200cc, states she voided 20 min ago

## 2013-10-30 NOTE — Progress Notes (Signed)
10/30/13 1750 ED CM spoke with Dr Romeo Apple, EDP. He requested assistance with urology appointment for the pt.  CM informed him that urology appointment is generally set up with the on call chs urologist and the pt generally works with the urologist's billing department.  [facility] nor the "orange card" program/[facility] offers discounted urologists.  See previous CM note indicating financial resources provided

## 2013-10-30 NOTE — ED Notes (Signed)
Pt had foley catheter put in one week ago in ED, pt here to have it removed, has not followed up with doctor outside of ED, states it was put in because she couldn't urinate

## 2013-10-30 NOTE — ED Provider Notes (Signed)
CSN: [id number]     Arrival date & time 10/30/13  1133 History   First MD Initiated Contact with Patient 10/30/13 1322     This chart was scribed for Lowella Dell, by Ladona Ridgel Day, ED scribe. This patient was seen in room WTR9/WTR9 and the patient's care was started at 1322.  Chief Complaint  Patient presents with  . Urinary Retention   The history is provided by the patient. No language interpreter was used.   HPI Comments: Sister in law interprets  [name] [name] is a 22 y.o. female who presents to the Emergency Department requesting to have a foley catheter removed that was placed 12/22 for urinary retention. Patient is G3 P2 Ab0. She does not recall her last normal period but states she's about [redacted] weeks pregnant. In her visit 12/22 in which foley was placed, consulted with urology on call who agreed with concerns and rec foley and was to f/u at office. She has not f/u with urology at this time. Since the time it was placed for urinary retetion, she reports normal urine color, no hematuria or dark urine. She reports was initially having some suprapubic discomfort when it was placed which has since resolved. She denies the feeling or urge to void. No positions seem to change her ability to void. She denies fever/chills, abdominal pain, diarrhea, constipation. She has no hx of falls. She is waiting for her first prenatal visit. She has had two normal full term pregnancies and  Vaginal deliveries.  OB will be [facility]  History reviewed. No pertinent past medical history. History reviewed. No pertinent past surgical history. History reviewed. No pertinent family history. History  Substance Use Topics  . Smoking status: Never Smoker   . Smokeless tobacco: Never Used  . Alcohol Use: No   OB History   Grav Para Term Preterm Abortions TAB SAB Ect Mult Living   3 2 1       2      Review of Systems  Constitutional: Negative for fever and chills.  HENT: Negative for congestion and rhinorrhea.    Respiratory: Negative for cough and shortness of breath.   Cardiovascular: Negative for chest pain.  Gastrointestinal: Negative for nausea, vomiting, abdominal pain and diarrhea.  Skin: Negative for color change and rash.  Neurological: Negative for syncope.  All other systems reviewed and are negative.   A complete 10 system review of systems was obtained and all systems are negative except as noted in the HPI and PMH.   Allergies  Review of patient's allergies indicates no known allergies.  Home Medications   Current Outpatient Rx  Name  Route  Sig  Dispense  Refill  . cephALEXin (KEFLEX) 500 MG capsule   Oral   Take 500 mg by mouth 3 (three) times daily.         . metroNIDAZOLE (METROGEL VAGINAL) 0.75 % vaginal gel   Vaginal   Place 1 Applicatorful vaginally 2 (two) times daily.   70 g   0   . Prenatal Vit-Fe Fumarate-FA (MULTIVITAMIN-PRENATAL) 27-0.8 MG TABS tablet   Oral   Take 1 tablet by mouth daily at 12 noon.          Triage Vitals: BP 108/71  Pulse 104  Temp(Src) 98.8 F (37.1 C)  SpO2 100% Physical Exam  Nursing note and vitals reviewed. Constitutional: She is oriented to person, place, and time. She appears well-developed and well-nourished. No distress.  HENT:  Head: Normocephalic and atraumatic.  Eyes: Conjunctivae  are normal. Right eye exhibits no discharge. Left eye exhibits no discharge.  Neck: Normal range of motion.  Cardiovascular: Normal rate, regular rhythm and normal heart sounds.   No murmur heard. Pulmonary/Chest: Effort normal and breath sounds normal. No respiratory distress. She has no wheezes. She has no rales.  Musculoskeletal: Normal range of motion. She exhibits no edema.  Neurological: She is alert and oriented to person, place, and time. She has normal reflexes. She displays normal reflexes. No cranial nerve deficit. She exhibits normal muscle tone. Coordination normal.  Cranial nerves intact 2-12. Motor strength 5+/5  throughout all extremities.  NV intact grossly  Skin: Skin is warm and dry.  Psychiatric: She has a normal mood and affect. Thought content normal.    ED Course  Procedures (including critical care time) DIAGNOSTIC STUDIES: Oxygen Saturation is 100% on room air, normal by my interpretation.     Labs Review Labs Reviewed  BASIC METABOLIC PANEL - Abnormal; Notable for the following:    Sodium 131 (*)    Potassium 3.1 (*)    Glucose, Bld 113 (*)    BUN 4 (*)    Creatinine, Ser 0.39 (*)    All other components within normal limits  URINALYSIS W MICROSCOPIC + REFLEX CULTURE - Abnormal; Notable for the following:    APPearance CLOUDY (*)    Specific Gravity, Urine 1.004 (*)    Hgb urine dipstick MODERATE (*)    Leukocytes, UA LARGE (*)    Bacteria, UA FEW (*)    Squamous Epithelial / LPF FEW (*)    All other components within normal limits  URINE CULTURE   Imaging Review No results found.  EKG Interpretation   None       MDM   1. UTI (urinary tract infection)   2. H/O urinary retention   Mild hypokalemia/Hyponatremia. Patient asymptomatic. Patient able to void following catheter removal. Suspect urine retention possible sequela of current UTI/STI. Patient currently on appropriate medication for both.  Discussed labs, and exam findings with patient. Advised continue medications as they have been prescribed. Follow up with [facility], make an appointment as soon as possible for further assessment and Obstetrical care. Resource guide attached for follow up reference for primary care provider. Please establish a regular doctor. Return to ED if symptoms worsen Patient agrees with plan. Discharged in good condition. Patient discussed with Dr. Purvis Sheffield.     I personally performed the services described in this documentation, which was scribed in my presence. The recorded information has been reviewed and is accurate.      Rudene Anda, PA-C 10/31/13  [phone]

## 2013-10-31 NOTE — ED Provider Notes (Signed)
Medical screening examination/treatment/procedure(s) were conducted as a shared visit with non-physician practitioner(s) and myself. I personally evaluated the patient during the encounter.   I interviewed and examined the patient. Lungs are CTAB. Cardiac exam wnl. Abdomen soft and gravid. Pt is ambulatory w/ normal strength in her LE's, normal sensation throughout, and 2+ reflexes in the bilateral LE's.   22 yo female G3 P2 presents with urinary retention, currently [redacted] wks pregnant. Pt has not had prenatal care at this point. Was seen in the ED recently for mild spotting, UTI, BV trichomonas. Was seen again 2 days ago for urinary retention. Bedside US showed bilateral hydronephrosis and urinary retention w/ >500cc. Urology was called and close f/u recommended. The pt presents today w/ request to remove foley. She denies any abd pain, vaginal bleeding, fever. She has been well otherwise. She states that she called Uro but was unable to f/u d/t $200 requirement prior to being evaluated. She is currently on keflex for UTI and flagyl for BV. Will remove foley and see if pt can urinate. Pt initially urinated and bladder scan 20 min later showed 200cc. She had to urinate again w/ post void residual showing 85cc. The pt would like removal of the foley. I think this is reasonable given her well appearance and ability to urinate here. Her sx may be assoc w/ a retroverted uterus assoc w/ the normal changes of pregnancy. I doubt her sx are associated w/ a spinal cord lesion such as transverse myelitis, MS, cauda equina, etc. Pt has non-contrib bmp here w/ normal Cr. UA still suspicious for UTI. Will have her continue the keflex/flagyl. I gave her and her mother strong return precautions to return for any abd pain, fever, weakness, or inability to void. Will rec she f/u w/ obgyn for evaluation. She understands.    Junius Argyle, MD 10/31/13 [phone]

## 2013-11-03 ENCOUNTER — Telehealth (HOSPITAL_COMMUNITY): Payer: Self-pay | Admitting: Emergency Medicine

## 2013-11-03 LAB — URINE CULTURE

## 2013-11-03 NOTE — ED Notes (Signed)
Post ED Visit - Positive Culture Follow-up: Successful Patient Follow-Up  Culture assessed and recommendations reviewed by: []  Wes Dulaney, Pharm.D., BCPS []  Celedonio Miyamoto, Pharm.D., BCPS []  Georgina Pillion, Pharm.D., BCPS []  [place], [place].D., BCPS, AAHIVP []  Estella Husk, Pharm.D., BCPS, AAHIVP [x]  Harland German, Pharm.D., BCPS  Positive urine culture  []  Patient discharged without antimicrobial prescription and treatment is now indicated []  Organism is resistant to prescribed ED discharge antimicrobial []  Patient with positive blood cultures [x]  Patient needs another antimicrobial prescription added  Changes discussed with ED provider: Arthor Captain PA-C New antibiotic prescription: add Amoxicillin 250 mg PO TID x 7 days    [name] [name] 11/03/2013, 11:32 AM

## 2013-11-03 NOTE — Progress Notes (Addendum)
ED Antimicrobial Stewardship Positive Culture Follow Up   [name] [name] is an 22 y.o. female who presented to [facility] on 10/30/2013 with a chief complaint of  Chief Complaint  Patient presents with  . Urinary Retention    Recent Results (from the past 720 hour(s))  URINE CULTURE     Status: None   Collection Time    10/25/13  5:30 PM      Result Value Range Status   Specimen Description URINE, CATHETERIZED   Final   Special Requests NONE   Final   Culture  Setup Time     Final   Value: 10/25/2013 23:56     Performed at [facility] Count     Final   Value: >=100,000 COLONIES/ML     Performed at [facility]   Culture     Final   Value: ESCHERICHIA COLI     Performed at [facility]   Report Status 10/27/2013 FINAL   Final   Organism ID, Bacteria ESCHERICHIA COLI   Final  GC/CHLAMYDIA PROBE AMP     Status: None   Collection Time    10/25/13  6:07 PM      Result Value Range Status   CT Probe RNA NEGATIVE  NEGATIVE Final   GC Probe RNA NEGATIVE  NEGATIVE Final   Comment: (NOTE)                                                                                               **Normal Reference Range: Negative**          Assay performed using the Gen-Probe APTIMA COMBO2 (R) Assay.     Acceptable specimen types for this assay include APTIMA Swabs (Unisex,     endocervical, urethral, or vaginal), first void urine, and ThinPrep     liquid based cytology samples.     Performed at [facility]  WET PREP, GENITAL     Status: Abnormal   Collection Time    10/25/13  6:07 PM      Result Value Range Status   Yeast Wet Prep HPF POC NONE SEEN  NONE SEEN Final   Trich, Wet Prep NONE SEEN  NONE SEEN Final   Clue Cells Wet Prep HPF POC FEW (*) NONE SEEN Final   WBC, Wet Prep HPF POC FEW (*) NONE SEEN Final  URINE CULTURE     Status: None   Collection Time    10/30/13  4:20 PM      Result Value Range Status   Specimen Description URINE, CLEAN  CATCH   Final   Special Requests NONE   Final   Culture  Setup Time     Final   Value: 10/31/2013 00:18     Performed at [facility] Count     Final   Value: 35,000 COLONIES/ML     Performed at [facility]   Culture     Final   Value: ESCHERICHIA COLI     ENTEROCOCCUS SPECIES     Performed at [facility]  Report Status 11/03/2013 FINAL   Final   Organism ID, Bacteria ESCHERICHIA COLI   Final   Organism ID, Bacteria ENTEROCOCCUS SPECIES   Final    [x]  Treated with cephalexin, organism resistant to prescribed antimicrobial []  Patient discharged originally without antimicrobial agent and treatment is now indicated  New antibiotic prescription: Amoxicillin 250mg  po tid for 7 days. Continue cephalexin until completion of therapy.  ED Provider: Madolyn Frieze, Pharm D 11/03/2013 10:48 AM Clinical Pharmacist Phone# [phone]

## 2013-11-06 NOTE — ED Notes (Signed)
Unable to contact via phone letter sent to [facility] address. 

## 2013-11-27 ENCOUNTER — Encounter (HOSPITAL_COMMUNITY): Payer: Self-pay | Admitting: *Deleted

## 2013-11-27 ENCOUNTER — Inpatient Hospital Stay (HOSPITAL_COMMUNITY)
Admission: AD | Admit: 2013-11-27 | Discharge: 2013-11-27 | Disposition: A | Discharge status: Home / Self Care | Payer: Medicaid Other | Source: Ambulatory Visit | Attending: Obstetrics & Gynecology | Admitting: Obstetrics & Gynecology

## 2013-11-27 ENCOUNTER — Inpatient Hospital Stay (HOSPITAL_COMMUNITY): Payer: Medicaid Other

## 2013-11-27 DIAGNOSIS — A6000 Herpesviral infection of urogenital system, unspecified: Secondary | ICD-10-CM | POA: Insufficient documentation

## 2013-11-27 DIAGNOSIS — O2390 Unspecified genitourinary tract infection in pregnancy, unspecified trimester: Secondary | ICD-10-CM | POA: Insufficient documentation

## 2013-11-27 DIAGNOSIS — O98519 Other viral diseases complicating pregnancy, unspecified trimester: Secondary | ICD-10-CM | POA: Insufficient documentation

## 2013-11-27 DIAGNOSIS — O4692 Antepartum hemorrhage, unspecified, second trimester: Secondary | ICD-10-CM

## 2013-11-27 DIAGNOSIS — O98819 Other maternal infectious and parasitic diseases complicating pregnancy, unspecified trimester: Secondary | ICD-10-CM | POA: Insufficient documentation

## 2013-11-27 DIAGNOSIS — A5909 Other urogenital trichomoniasis: Secondary | ICD-10-CM | POA: Insufficient documentation

## 2013-11-27 DIAGNOSIS — O209 Hemorrhage in early pregnancy, unspecified: Secondary | ICD-10-CM | POA: Insufficient documentation

## 2013-11-27 DIAGNOSIS — N72 Inflammatory disease of cervix uteri: Secondary | ICD-10-CM | POA: Insufficient documentation

## 2013-11-27 HISTORY — DX: Other specified health status: Z78.9

## 2013-11-27 LAB — URINE MICROSCOPIC-ADD ON

## 2013-11-27 LAB — URINALYSIS, ROUTINE W REFLEX MICROSCOPIC
Bilirubin Urine: NEGATIVE
GLUCOSE, UA: NEGATIVE mg/dL
Ketones, ur: NEGATIVE mg/dL
NITRITE: POSITIVE — AB
PH: 7.5 (ref 5.0–8.0)
Protein, ur: NEGATIVE mg/dL
SPECIFIC GRAVITY, URINE: 1.01 (ref 1.005–1.030)
Urobilinogen, UA: 1 mg/dL (ref 0.0–1.0)

## 2013-11-27 LAB — WET PREP, GENITAL
Clue Cells Wet Prep HPF POC: NONE SEEN
YEAST WET PREP: NONE SEEN

## 2013-11-27 MED ORDER — METRONIDAZOLE 500 MG PO TABS
500.0000 mg | ORAL_TABLET | Freq: Once | ORAL | Status: AC
  Administered 2013-11-27: 500 mg via ORAL
  Filled 2013-11-27: qty 1

## 2013-11-27 MED ORDER — METRONIDAZOLE 500 MG PO TABS: 500.0000 mg | ORAL_TABLET | Freq: Two times a day (BID) | ORAL | Status: DC

## 2013-11-27 MED ORDER — PRENATAL PLUS 27-1 MG PO TABS: 1.0000 | ORAL_TABLET | Freq: Every day | ORAL | Status: DC

## 2013-11-27 NOTE — Discharge Instructions (Signed)
Trichomoniasis °Trichomoniasis is an infection, caused by the Trichomonas organism, that affects both women and men. In women, the outer female genitalia and the vagina are affected. In men, the penis is mainly affected, but the prostate and other reproductive organs can also be involved. Trichomoniasis is a sexually transmitted disease (STD) and is most often passed to another person through sexual contact. The majority of people who get trichomoniasis do so from a sexual encounter and are also at risk for other STDs. °CAUSES  °· Sexual intercourse with an infected partner. °· It can be present in swimming pools or hot tubs. °SYMPTOMS  °· Abnormal gray-green frothy vaginal discharge in women. °· Vaginal itching and irritation in women. °· Itching and irritation of the area outside the vagina in women. °· Penile discharge with or without pain in males. °· Inflammation of the urethra (urethritis), causing painful urination. °· Bleeding after sexual intercourse. °RELATED COMPLICATIONS °· Pelvic inflammatory disease. °· Infection of the uterus (endometritis). °· Infertility. °· Tubal (ectopic) pregnancy. °· It can be associated with other STDs, including gonorrhea and chlamydia, hepatitis B, and HIV. °COMPLICATIONS DURING PREGNANCY °· Early (premature) delivery. °· Premature rupture of the membranes (PROM). °· Low birth weight. °DIAGNOSIS  °· Visualization of Trichomonas under the microscope from the vagina discharge. °· Ph of the vagina greater than 4.5, tested with a test tape. °· Trich Rapid Test. °· Culture of the organism, but this is not usually needed. °· It may be found on a Pap test. °· Having a "strawberry cervix,"which means the cervix looks very red like a strawberry. °TREATMENT  °· You may be given medication to fight the infection. Inform your caregiver if you could be or are pregnant. Some medications used to treat the infection should not be taken during pregnancy. °· Over-the-counter medications or  creams to decrease itching or irritation may be recommended. °· Your sexual partner will need to be treated if infected. °HOME CARE INSTRUCTIONS  °· Take all medication prescribed by your caregiver. °· Take over-the-counter medication for itching or irritation as directed by your caregiver. °· Do not have sexual intercourse while you have the infection. °· Do not douche or wear tampons. °· Discuss your infection with your partner, as your partner may have acquired the infection from you. Or, your partner may have been the person who transmitted the infection to you. °· Have your sex partner examined and treated if necessary. °· Practice safe, informed, and protected sex. °· See your caregiver for other STD testing. °SEEK MEDICAL CARE IF:  °· You still have symptoms after you finish the medication. °· You have an oral temperature above 102° F (38.9° C). °· You develop belly (abdominal) pain. °· You have pain when you urinate. °· You have bleeding after sexual intercourse. °· You develop a rash. °· The medication makes you sick or makes you throw up (vomit). °Document Released: 04/17/2001 Document Revised: 01/14/2012 Document Reviewed: 05/13/2009 °ExitCare® Patient Information ©2014 [employer]. ° °

## 2013-11-27 NOTE — MAU Provider Note (Signed)
Chief Complaint:  Vaginal Bleeding  Initiated contact at 1419. FOB for interpretation (Micronesian language)     HPI: [name] [name] is a 23 y.o. G3P2002 at [redacted]w[redacted]d BEGA by 17wk scan who presents to maternity admissions reporting light vaginal spotting for about a week that became heavier today and is dark red. Had episode of light spotting in December. No abdominal pain. No dysuria, frequency, urgency of urination. Sexually active twice since vaginal and urinary infections dx'd. GC/CT neg 10/25/13.  Denies contractions, leakage of fluid. Good fetal movement.   Pregnancy Course: NPC but plans to go to [facility]. Had trich (10/25/13) tx'd Metrogel, E Coli and Enterococcus UTI (10/25/13) tx'd Keflex, then 11/03/13 tx'd Amox x 7d. Urinary retention and indwelling Foley cath 12/22-12/26/14. Plans to make urology appointment as recommended now that she has MC.   Past Medical History: Past Medical History  Diagnosis Date  . Medical history non-contributory     Past obstetric history: OB History  Gravida Para Term Preterm AB SAB TAB Ectopic Multiple Living  3 2 1       2     # Outcome Date GA Lbr Len/2nd Weight Sex Delivery Anes PTL Lv  3 CUR           2 TRM 02/23/12 [redacted]w[redacted]d 04:43 / 00:10 3.771 kg (8 lb 5 oz) F SVD None  Y     Comments: None  1 PAR 02/02/09    M SVD   Y      Past Surgical History: Past Surgical History  Procedure Laterality Date  . No past surgeries       Family History: History reviewed. No pertinent family history.  Social History: History  Substance Use Topics  . Smoking status: Never Smoker   . Smokeless tobacco: Never Used  . Alcohol Use: No    Allergies: No Known Allergies  Meds:  Prescriptions prior to admission  Medication Sig Dispense Refill  . metroNIDAZOLE (METROGEL VAGINAL) 0.75 % vaginal gel Place 1 Applicatorful vaginally 2 (two) times daily.  70 g  0  . Prenatal Vit-Fe Fumarate-FA (MULTIVITAMIN-PRENATAL) 27-0.8 MG TABS tablet Take 1 tablet  by mouth daily at 12 noon.        ROS: Pertinent findings in history of present illness.  Physical Exam  Blood pressure 112/64, pulse 92, temperature 98.3 F (36.8 C), temperature source Oral, resp. rate 18, height 4\' 11"  (1.499 m), weight 68.947 kg (152 lb). GENERAL: Well-developed, well-nourished female in no acute distress.  HEENT: normocephalic HEART: normal rate RESP: normal effort ABDOMEN: Soft, non-tender, gravid appropriate for gestational age. FHR DT 140s EXTREMITIES: Nontender, no edema NEURO: alert and oriented SPECULUM EXAM: NEFG, dark blood sm-mod apparently from cervical os. .5 cm tender papule ant lip at ext os> HSV culture sent (but unable to debride lesion); minimal discharge    VE: cx contour irregular, mildly tender; L/C  Labs: Results for orders placed during the hospital encounter of 11/27/13 (from the past 24 hour(s))  URINALYSIS, ROUTINE W REFLEX MICROSCOPIC     Status: Abnormal   Collection Time    11/27/13 12:35 PM      Result Value Range   Color, Urine YELLOW  YELLOW   APPearance HAZY (*) CLEAR   Specific Gravity, Urine 1.010  1.005 - 1.030   pH 7.5  5.0 - 8.0   Glucose, UA NEGATIVE  NEGATIVE mg/dL   Hgb urine dipstick LARGE (*) NEGATIVE   Bilirubin Urine NEGATIVE  NEGATIVE   Ketones, ur NEGATIVE  NEGATIVE mg/dL   Protein, ur NEGATIVE  NEGATIVE mg/dL   Urobilinogen, UA 1.0  0.0 - 1.0 mg/dL   Nitrite POSITIVE (*) NEGATIVE   Leukocytes, UA LARGE (*) NEGATIVE  URINE MICROSCOPIC-ADD ON     Status: Abnormal   Collection Time    11/27/13 12:35 PM      Result Value Range   Squamous Epithelial / LPF MANY (*) RARE   WBC, UA 11-20  <3 WBC/hpf   RBC / HPF 7-10  <3 RBC/hpf   Bacteria, UA MANY (*) RARE   Urine-Other MUCOUS PRESENT    WET PREP, GENITAL     Status: Abnormal   Collection Time    11/27/13  2:40 PM      Result Value Range   Yeast Wet Prep HPF POC NONE SEEN  NONE SEEN   Trich, Wet Prep MANY (*) NONE SEEN   Clue Cells Wet Prep HPF POC NONE  SEEN  NONE SEEN   WBC, Wet Prep HPF POC MODERATE (*) NONE SEEN    Imaging:   US: Biometry c/w 8250w5d, normal placentation, normal AFV  MAU Course: Urine culture sent. HSV culture sent of cx lesion  D/W Dr. Debroah LoopArnold Assessment: 1. Trichomonal cervicitis   2. Second trimester bleeding   G3P2002 at 5263w5d HSV lesion vs Nabothian cyst of cervix  Plan: Discharge home with bleeding precautions, pelvic rest    Medication List         metroNIDAZOLE 500 MG tablet  Commonly known as:  FLAGYL  Take 1 tablet (500 mg total) by mouth every 12 (twelve) hours.     prenatal vitamin w/FE, FA 27-1 MG Tabs tablet  Take 1 tablet by mouth daily.       Follow-up Information   [facility]. Schedule an appointment as soon as possible for a visit in 1 week.   Contact information:   [address] [place] [id number] [phone]     Danae OrleansDeirdre C Briley Bumgarner, CNM 11/27/2013 2:18 PM

## 2013-11-27 NOTE — MAU Note (Addendum)
Started bleeding last month. Has not been seen anywhere yet with preg.Marland Kitchen. No PNC.  Tried to get appt  At the clinic downstairs, was told they would call her back, they have not called.

## 2013-11-29 LAB — URINE CULTURE

## 2013-11-30 LAB — HERPES SIMPLEX VIRUS CULTURE
Culture: NOT DETECTED
Special Requests: NORMAL

## 2013-12-01 ENCOUNTER — Telehealth (HOSPITAL_COMMUNITY): Payer: Self-pay | Admitting: Obstetrics and Gynecology

## 2013-12-01 MED ORDER — CEPHALEXIN 500 MG PO CAPS: 500.0000 mg | ORAL_CAPSULE | Freq: Four times a day (QID) | ORAL | Status: DC

## 2013-12-01 MED ORDER — NITROFURANTOIN MONOHYD MACRO 100 MG PO CAPS: 100.0000 mg | ORAL_CAPSULE | Freq: Two times a day (BID) | ORAL | Status: DC

## 2013-12-01 NOTE — Telephone Encounter (Signed)
Keflex sent to her pharmacy for UTI. Message left.

## 2013-12-04 ENCOUNTER — Encounter (HOSPITAL_COMMUNITY): Payer: Self-pay | Admitting: Obstetrics and Gynecology

## 2013-12-08 ENCOUNTER — Encounter: Payer: Self-pay | Admitting: *Deleted

## 2013-12-08 ENCOUNTER — Telehealth: Payer: Self-pay | Admitting: *Deleted

## 2013-12-08 NOTE — Telephone Encounter (Signed)
Letter sent.

## 2013-12-08 NOTE — Telephone Encounter (Signed)
Message copied by Dorothyann PengHAIZLIP, Dayanne Yiu E on Tue Dec 08, 2013  2:55 PM ------      Message from: Danae OrleansPOE, DEIRDRE C      Created: Tue Dec 08, 2013  1:45 PM       Rx Keflex 500 qid x 7d ------

## 2013-12-08 NOTE — Telephone Encounter (Signed)
Note from [facility] to order Keflex 500 mg qid for 7 days.  Neither number listed is valid.  Will send letter.

## 2013-12-14 ENCOUNTER — Encounter: Payer: Self-pay | Admitting: *Deleted

## 2013-12-16 ENCOUNTER — Encounter (HOSPITAL_COMMUNITY): Payer: Self-pay | Admitting: *Deleted

## 2013-12-16 ENCOUNTER — Other Ambulatory Visit: Payer: Self-pay | Admitting: Obstetrics and Gynecology

## 2013-12-16 ENCOUNTER — Inpatient Hospital Stay (HOSPITAL_COMMUNITY)
Admission: AD | Admit: 2013-12-16 | Discharge: 2013-12-28 | DRG: 765 | Disposition: A | Discharge status: Home / Self Care | Payer: Medicaid Other | Source: Ambulatory Visit | Attending: Obstetrics and Gynecology | Admitting: Obstetrics and Gynecology

## 2013-12-16 DIAGNOSIS — O4100X0 Oligohydramnios, unspecified trimester, not applicable or unspecified: Secondary | ICD-10-CM | POA: Diagnosis present

## 2013-12-16 DIAGNOSIS — Z98891 History of uterine scar from previous surgery: Secondary | ICD-10-CM

## 2013-12-16 DIAGNOSIS — O321XX0 Maternal care for breech presentation, not applicable or unspecified: Principal | ICD-10-CM

## 2013-12-16 DIAGNOSIS — O42919 Preterm premature rupture of membranes, unspecified as to length of time between rupture and onset of labor, unspecified trimester: Secondary | ICD-10-CM | POA: Diagnosis present

## 2013-12-16 DIAGNOSIS — D649 Anemia, unspecified: Secondary | ICD-10-CM | POA: Diagnosis present

## 2013-12-16 DIAGNOSIS — O2390 Unspecified genitourinary tract infection in pregnancy, unspecified trimester: Secondary | ICD-10-CM | POA: Diagnosis present

## 2013-12-16 DIAGNOSIS — O4290 Premature rupture of membranes, unspecified as to length of time between rupture and onset of labor, unspecified weeks of gestation: Secondary | ICD-10-CM | POA: Diagnosis present

## 2013-12-16 DIAGNOSIS — O9902 Anemia complicating childbirth: Secondary | ICD-10-CM | POA: Diagnosis present

## 2013-12-16 DIAGNOSIS — O0930 Supervision of pregnancy with insufficient antenatal care, unspecified trimester: Secondary | ICD-10-CM

## 2013-12-16 DIAGNOSIS — N390 Urinary tract infection, site not specified: Secondary | ICD-10-CM | POA: Diagnosis present

## 2013-12-16 HISTORY — DX: History of uterine scar from previous surgery: Z98.891

## 2013-12-16 HISTORY — DX: Maternal care for breech presentation, not applicable or unspecified: O32.1XX0

## 2013-12-16 LAB — CBC
HCT: 31.4 % — ABNORMAL LOW (ref 36.0–46.0)
HEMOGLOBIN: 10.9 g/dL — AB (ref 12.0–15.0)
MCH: 29.1 pg (ref 26.0–34.0)
MCHC: 34.7 g/dL (ref 30.0–36.0)
MCV: 83.7 fL (ref 78.0–100.0)
Platelets: 334 10*3/uL (ref 150–400)
RBC: 3.75 MIL/uL — ABNORMAL LOW (ref 3.87–5.11)
RDW: 12.8 % (ref 11.5–15.5)
WBC: 14.8 10*3/uL — ABNORMAL HIGH (ref 4.0–10.5)

## 2013-12-16 LAB — DIFFERENTIAL
Basophils Absolute: 0 10*3/uL (ref 0.0–0.1)
Basophils Relative: 0 % (ref 0–1)
Eosinophils Absolute: 0.8 10*3/uL — ABNORMAL HIGH (ref 0.0–0.7)
Eosinophils Relative: 5 % (ref 0–5)
LYMPHS ABS: 2.1 10*3/uL (ref 0.7–4.0)
LYMPHS PCT: 14 % (ref 12–46)
Monocytes Absolute: 0.6 10*3/uL (ref 0.1–1.0)
Monocytes Relative: 4 % (ref 3–12)
NEUTROS ABS: 11.3 10*3/uL — AB (ref 1.7–7.7)
NEUTROS PCT: 77 % (ref 43–77)

## 2013-12-16 LAB — WET PREP, GENITAL
Clue Cells Wet Prep HPF POC: NONE SEEN
Trich, Wet Prep: NONE SEEN
Yeast Wet Prep HPF POC: NONE SEEN

## 2013-12-16 LAB — TYPE AND SCREEN
ABO/RH(D): O POS
Antibody Screen: NEGATIVE

## 2013-12-16 LAB — RAPID HIV SCREEN (WH-MAU): SUDS RAPID HIV SCREEN: NONREACTIVE

## 2013-12-16 MED ORDER — CALCIUM CARBONATE ANTACID 500 MG PO CHEW: 2.0000 | CHEWABLE_TABLET | ORAL | Status: DC | PRN

## 2013-12-16 MED ORDER — SODIUM CHLORIDE 0.9 % IV SOLN
2.0000 g | Freq: Four times a day (QID) | INTRAVENOUS | Status: AC
  Administered 2013-12-16 – 2013-12-18 (×8): 2 g via INTRAVENOUS
  Filled 2013-12-16 (×8): qty 2000

## 2013-12-16 MED ORDER — ACETAMINOPHEN 325 MG PO TABS: 650.0000 mg | ORAL_TABLET | ORAL | Status: DC | PRN

## 2013-12-16 MED ORDER — AMOXICILLIN 500 MG PO CAPS
500.0000 mg | ORAL_CAPSULE | Freq: Three times a day (TID) | ORAL | Status: DC
  Administered 2013-12-18 – 2013-12-19 (×3): 500 mg via ORAL
  Filled 2013-12-16 (×6): qty 1

## 2013-12-16 MED ORDER — ZOLPIDEM TARTRATE 5 MG PO TABS: 5.0000 mg | ORAL_TABLET | Freq: Every evening | ORAL | Status: DC | PRN

## 2013-12-16 MED ORDER — PRENATAL MULTIVITAMIN CH
1.0000 | ORAL_TABLET | Freq: Every day | ORAL | Status: DC
  Administered 2013-12-17 – 2013-12-24 (×8): 1 via ORAL
  Filled 2013-12-16: qty 1
  Filled 2013-12-16: qty 2
  Filled 2013-12-16 (×6): qty 1

## 2013-12-16 MED ORDER — BETAMETHASONE SOD PHOS & ACET 6 (3-3) MG/ML IJ SUSP
12.0000 mg | INTRAMUSCULAR | Status: AC
  Administered 2013-12-16 – 2013-12-17 (×2): 12 mg via INTRAMUSCULAR
  Filled 2013-12-16 (×2): qty 2

## 2013-12-16 MED ORDER — DOCUSATE SODIUM 100 MG PO CAPS
100.0000 mg | ORAL_CAPSULE | Freq: Every day | ORAL | Status: DC
  Administered 2013-12-17 – 2013-12-24 (×8): 100 mg via ORAL
  Filled 2013-12-16 (×8): qty 1

## 2013-12-16 MED ORDER — AZITHROMYCIN 500 MG PO TABS
500.0000 mg | ORAL_TABLET | Freq: Every day | ORAL | Status: AC
  Administered 2013-12-16 – 2013-12-22 (×7): 500 mg via ORAL
  Filled 2013-12-16 (×7): qty 1

## 2013-12-16 NOTE — H&P (Addendum)
[name] [name] is a 23 y.o. female G3P2002 at 7124 3/7 weeks (04/04/14 by 17 week US and unsure LMP) presenting for PPROM.  Pt presented to office today for initiation of prenatal care and had an US performed showing severe oligohydramnios with an AFI of 1.8.  The baby measured about 33%ile and Dopplers were normal.  The only prenatal care the patient had until today were two MAU visits and she had two limited US which showed a BPD c/w 17 weeks and normal fluid, she also had a visit at about 21 weeks and fluid noted to be normal.  History is very limited due to the patient speaks a rare language (pompeian) and no interpreter has yet been found.  Upon questioning the patient reported leakage of clear fluid beginning 3 days ago.  She has had no contractions.  She states her other pregnancies were uncomplicated and delivered at term.   Maternal Medical History:  Reason for admission: Rupture of membranes.   Contractions: Onset was more than 2 days ago.   Frequency: rare.   Perceived severity is mild.    Fetal activity: Perceived fetal activity is normal.      OB History   Grav Para Term Preterm Abortions TAB SAB Ect Mult Living   3 2 2       2     NSVD # 1 in [place] no problems [facility]  No prenatal care....came in active labor at 8cm then delivered 8+lb baby with no problems   Past Medical History  Diagnosis Date  . Medical history non-contributory    Past Surgical History  Procedure Laterality Date  . No past surgeries     Family History: Family history is unknown by patient. Social History:  reports that she has never smoked. She has never used smokeless tobacco. She reports that she does not drink alcohol or use illicit drugs.   Prenatal Transfer Tool  Maternal Diabetes: not yet tested Genetic Screening: too late to care Maternal Ultrasounds/Referrals: Abnormal:  Findings:   Other:oligohydramnios Fetal Ultrasounds or other Referrals:  None Maternal Substance  Abuse:  No Significant Maternal Medications:  None Significant Maternal Lab Results:  None Other Comments:  None  ROS    Blood pressure 102/50, pulse 84, temperature 98.2 F (36.8 C), temperature source Oral, resp. rate 18, height 4\' 11"  (1.499 m), weight 68.947 kg (152 lb). Maternal Exam:  Uterine Assessment: Contraction strength is mild.  Abdomen: Patient reports no abdominal tenderness. Fetal presentation: breech  Introitus: Normal vulva. Normal vagina.  Ferning test: positive.  Nitrazine test: positive.  Cervix: Cervix evaluated by sterile speculum exam.     Physical Exam  Constitutional: She is oriented to person, place, and time. She appears well-developed and well-nourished.  Cardiovascular: Normal rate and regular rhythm.   Respiratory: Effort normal.  GI: Soft.  Neurological: She is alert and oriented to person, place, and time.  Psychiatric: She has a normal mood and affect.  Does not speak english fluently    Prenatal labs: ABO, Rh:   Antibody:   Rubella:   RPR: NON REACTIVE (12/21 1816)  HBsAg:    HIV: NON REACTIVE (12/21 1816)  GBS:     Assessment/Plan: Pt admitted with PPROM at 24+ weeks.  She has not yet had any prenatal care to date and will have all labs done on admission.  Antibiotics for PPROM.  Betamethasone.   The patient is breech and I have tried to explain to her she will need  a c-section for delivery.  I am trying to make an appointment with the language line to find a translator for her.   Oliver Pila 12/16/2013, 7:47 PM

## 2013-12-16 NOTE — Progress Notes (Signed)
Patient ID: [name] KusterNorleen [name], female   DOB: 12/10/1990, 23 y.o.   MRN: [id number] Upon reviewing her hospital chart, it was noted the patient had trichomonas and a UTI.  SHe was treated with keflex and Macrobid.  She states she had flagyl as well. We will recheck a wet prep.

## 2013-12-16 NOTE — Plan of Care (Signed)
Problem: Consults Goal: Birthing Suites Patient Information Press F2 to bring up selections list Outcome: Completed/Met Date Met:  12/16/13  Pt < [redacted] weeks EGA

## 2013-12-17 LAB — HEPATITIS B SURFACE ANTIGEN: HEP B S AG: NEGATIVE

## 2013-12-17 LAB — RPR: RPR Ser Ql: NONREACTIVE

## 2013-12-17 LAB — RUBELLA SCREEN: Rubella: 0.86 Index (ref <0.90)

## 2013-12-17 MED ORDER — SODIUM CHLORIDE 0.9 % IJ SOLN
3.0000 mL | Freq: Two times a day (BID) | INTRAMUSCULAR | Status: DC
  Administered 2013-12-17 – 2013-12-24 (×14): 3 mL via INTRAVENOUS

## 2013-12-17 NOTE — Progress Notes (Addendum)
Patient ID: [name] KusterNorleen [name], female   DOB: 12/03/1990, 23 y.o.   MRN: [id number] HD 2 PPROM 24 4/7 weeks  Pt had good night.  No contractions +LOF afeb vss Gravid and NT Received betamethasone x 1, second dose tonight On Amp and Azithro Wet prep negative GBS, Urine cx and GC/Chlam pending Prenatals look ok except Rubella non-immune Still no success on finding an interpreter

## 2013-12-17 NOTE — Progress Notes (Signed)
RN called CSW to request assistance in obtaining interpreting services.  Pt speaks [place] or [place].  Interpreting services are not available through language resources or pacific interpreting services.  Per previous notes, pts sister in-law interpreted to for pt.  CSW spoke with Julious OkaLilly Primojimina [phone]), pts sister in-law who plans to come interpret for pt some time after 3pm.  Pt was able to read the "Consent to Interpreting" Services form in CSW & RNs presence & verbalized an understanding.  Pt seems to understand, read & speak some English.  CSW completed form identifying Lilly as an approved interpreter & place a copy in the chart.  Charlesetta ShanksLaura Vail, Director of Interpreting notified of situation.  CSW available to assist further if needed however signing off at this time.

## 2013-12-17 NOTE — Progress Notes (Signed)
12/17/13 1300  Clinical Encounter Type  Visited With Health care provider Marrion Coy(Torrey Ballinas Brewer, RN)  Visit Type Initial  Recommendations Please page chaplain when interpreter is available.   Spiritual Care is aware of patient's situation and that arrangements are being made to find an interpreter.  Please page Canadian at [phone] when an interpreter-assisted visit would be possible and appropriate for spiritual, emotional, and social support in what may be a stressful and isolating situation.  Thank you!  [address]Chaplain Arlee Santosuosso [place], [place] [phone]

## 2013-12-17 NOTE — Progress Notes (Signed)
SW here to see pt regarding interpreter SW called family member Lilly and she is coming after 3pm to see pt Pt stated it is ok for her to interpret pt read form out loud to SW and RN and voiced understanding of form allowing Lilly to interpret for her

## 2013-12-17 NOTE — Progress Notes (Signed)
No interpreter available through language line or current case management language services. Case management and social work notified we need an interpreter by Marrion CoyLisa Brewer RN.

## 2013-12-18 LAB — CULTURE, BETA STREP (GROUP B ONLY)

## 2013-12-18 LAB — GC/CHLAMYDIA PROBE AMP
CT PROBE, AMP APTIMA: NEGATIVE
GC Probe RNA: NEGATIVE

## 2013-12-18 NOTE — Consult Note (Signed)
Asked by Dr Senaida Oresichardson to speak to Ms Samuel BoucheLucas to discuss outcome of extreme preterm pregnancy. Chart reviewed. She is 24 5/7 weeks, with ruptured membranes since 2/8, without contractions. Other complications during pregnancy include breech presentation, oligo, unknown GBS. She has received  2 doses of  betamethasone and is currently on Amp and Zithro.  By recent US, growth is appropriate.  I discussed resuscitation and various modes of respiratory support. I discussed usual survival rate and common morbidities associated with this gestation such as RDS, GI immaturity, and increased risk of infection with PROM. Discussed need for temp support, HAL, umbilical lines,  and LOS. I discussed decreasing morbidity and decreased LOS with increasing gest. I also discussed breast feeding and benefits especially to a preterm baby.   This information was given via translation provided by Ms Samuel BoucheLucas' sister as there is no available translator for their language in Language line.   I spent 40  minutes with this consult, more than 50% of the time was with face-to-face counseling with Ms Terrill MohrLucas   Deondra Wigger Q Avalee Castrellon, MD  Neonatologist

## 2013-12-18 NOTE — Progress Notes (Addendum)
Patient ID: [name] [name], female   DOB: 03/01/1991, 23 y.o.   MRN: [id number] HD #3 PPROM /Breech  24 5/7 weeks by 17 week US dating Pt stable overnight +FM +LOF no contractions  afeb vss Fundus NT  S/p betamethasone x 2 On antibiotics amp/azithro day 3 GBS culture is positive, urine pending  We have identified one family member (lily) that speaks fluent english and is able to serve as our Nurse, learning disabilitytranslator. She is willing to be contacted for translation in emergency. Through this family member, I have re-counseled the pt on events up to this date and gone over the c-section process in detail. We discussed risks of bleeding, infection, and possible damage to other organs.  I also d/w her the possibility of a classical c-section and the need to have all c-section deliveries after that if it was needed.  She was given the opportunity to ask questions and had none. NICU and anesthesia have been made aware that translator is here today so they can speak with patient.

## 2013-12-18 NOTE — Progress Notes (Signed)
Dr. Mikle Boswortharlos talked with patient via her interpreter Tonna Corner(Lily). Anesthesia and Chaplain services notified interpreter is here.

## 2013-12-18 NOTE — Progress Notes (Signed)
12/18/13 1100  Clinical Encounter Type  Visited With Patient and family together (sister-in-law (husband's sister) [name])  Visit Type Spiritual support;Social support  Spiritual Encounters  Spiritual Needs Emotional;Prayer  Stress Factors  Patient Stress Factors Loss of control;Major life changes (worries and social isolation of PROM/antenatal stay)   Visited with [name] [name] and her SIL [name] [name], who served as interpreter with pt's permission, to introduce Spiritual Care and chaplain availability.  Per SIL, [name] [name] understands more English than she is comfortable speaking.  She has two children at home, ages 343 and 1, both of whom were term. They were able to visit pt last night.  [name] came [place] from [place] in 2012 and [place], and near his sister [name] and other family.  An extended family member (husband's cousin?) is watching [name] [name]'s children while pt is here.  SIL [name] has lived in the [place] since 1996, has one child at home, and is also pregnant.  [name] and [name] are at approximately the same gestation, which adds a level of emotional complexity that we talked about together.  Family is Christian Advertising copywriter([name] [name], Catholic); SIL plans to Production assistant, radiocontact pastor for support when she leaves hospital today.  Introduced services, built rapport, offered prayer per request.  Family welcoming, appreciative, and aware of ongoing chaplain availability.  Gates will follow, but please also page as needed, particularly when SIL/ interpreter available:  [phone].  Thank you.  [address]Chaplain Amaurie Wandel [place], [place] [phone]

## 2013-12-19 ENCOUNTER — Encounter (HOSPITAL_COMMUNITY): Payer: Self-pay | Admitting: Anesthesiology

## 2013-12-19 ENCOUNTER — Inpatient Hospital Stay (HOSPITAL_COMMUNITY): Payer: Medicaid Other

## 2013-12-19 LAB — URINE CULTURE: Colony Count: >=100000

## 2013-12-19 LAB — TYPE AND SCREEN
ABO/RH(D): O POS
Antibody Screen: NEGATIVE

## 2013-12-19 MED ORDER — CEPHALEXIN 500 MG PO CAPS
500.0000 mg | ORAL_CAPSULE | Freq: Four times a day (QID) | ORAL | Status: DC
  Administered 2013-12-19 – 2013-12-24 (×21): 500 mg via ORAL
  Filled 2013-12-19 (×25): qty 1

## 2013-12-19 NOTE — Anesthesia Preprocedure Evaluation (Deleted)
Anesthesia Evaluation  Patient identified by MRN, date of birth, ID band Patient awake    Reviewed: Allergy & Precautions, H&P , NPO status , Patient's Chart, lab work & pertinent test results, reviewed documented beta blocker date and time   History of Anesthesia Complications Negative for: history of anesthetic complications  Airway Mallampati: I TM Distance: >3 FB Neck ROM: full    Dental  (+) Teeth Intact   Pulmonary neg pulmonary ROS,  breath sounds clear to auscultation        Cardiovascular negative cardio ROS  Rhythm:regular Rate:Normal     Neuro/Psych negative neurological ROS  negative psych ROS   GI/Hepatic negative GI ROS, Neg liver ROS,   Endo/Other  negative endocrine ROS  Renal/GU negative Renal ROS     Musculoskeletal   Abdominal   Peds  Hematology  (+) anemia ,   Anesthesia Other Findings   Reproductive/Obstetrics (+) Pregnancy (24.6 weeks, SROM)                           Anesthesia Physical Anesthesia Plan  ASA: II  Anesthesia Plan: Spinal   Post-op Pain Management:    Induction:   Airway Management Planned:   Additional Equipment:   Intra-op Plan:   Post-operative Plan:   Informed Consent: I have reviewed the patients History and Physical, chart, labs and discussed the procedure including the risks, benefits and alternatives for the proposed anesthesia with the patient or authorized representative who has indicated his/her understanding and acceptance.   Dental Advisory Given  Plan Discussed with: Surgeon and CRNA  Anesthesia Plan Comments: (Per Dr Ellyn HackBovard, wants pt to understand what will happen in event of emergent C/S.  Discussed spinal vs GA in the event of urgent/emergent C/S with interpreter via phone (pts sister-in-law is the interpreter, no interpreter for her language via [place]).  )        Anesthesia Quick Evaluation

## 2013-12-19 NOTE — Progress Notes (Addendum)
Patient ID: [name] [name], female   DOB: 06/01/1991, 23 y.o.   MRN: [id number]  22yo G1 at 24+3 with PPROM S/p BMZ, On antibiotics for latency  Doing well, no c/o's, +FM, cont LOF, last night and today with VB - light and thin with fluid, no ctx  AFVSS gen NAD Abd soft, FNT  FHTs 145-150, looks good for gestational age toco none  Continue current mgmt.    Will get US for presentation, etc. Anesthesia made aware of inc VB.

## 2013-12-20 ENCOUNTER — Encounter (HOSPITAL_COMMUNITY): Payer: Self-pay | Admitting: *Deleted

## 2013-12-20 NOTE — Progress Notes (Addendum)
Patient ID: [name] KusterNorleen [name], female   DOB: 08/06/1991, 23 y.o.   MRN: [id number]  23+5 PPROM Will change dates per MFM reccs with US 2/14.  Good growth, limited nl anat.  D/W NICU.   No C/O's some ctx overnight, +FM, cont LOF - bloody.  AFVSS gen NAD FHTs 140-150's, mod var - appropriate for gestational age toco - occ overnight  22yo G1P0 at 23+5  S/p BMZ, getting abx for latency Abx also to cover UTI Dates have changed - but continue current mgmt  D/W pt and Lilly (interpreter) understands dates are changed and treatment for UTI.

## 2013-12-21 NOTE — Progress Notes (Signed)
HD #6, [redacted]W[redacted]D, PPROM Doing ok, nothing new, a few ctx Afeb, VSS Fundus NT Continue abx and expectant mgmt

## 2013-12-21 NOTE — Progress Notes (Signed)
Ur chart review completed.  

## 2013-12-22 LAB — TYPE AND SCREEN
ABO/RH(D): O POS
Antibody Screen: NEGATIVE

## 2013-12-22 NOTE — Progress Notes (Signed)
HD #7, [redacted]W[redacted]D, PPROM Doing ok, nothing new, +FM Afeb, VSS FHT reassuring Continue observation

## 2013-12-22 NOTE — Progress Notes (Signed)
Pt off the monitor after reassurring FHR  

## 2013-12-23 ENCOUNTER — Inpatient Hospital Stay (HOSPITAL_COMMUNITY): Payer: Medicaid Other

## 2013-12-23 LAB — CBC
HCT: 30.6 % — ABNORMAL LOW (ref 36.0–46.0)
Hemoglobin: 10.6 g/dL — ABNORMAL LOW (ref 12.0–15.0)
MCH: 28.5 pg (ref 26.0–34.0)
MCHC: 34.6 g/dL (ref 30.0–36.0)
MCV: 82.3 fL (ref 78.0–100.0)
PLATELETS: 307 10*3/uL (ref 150–400)
RBC: 3.72 MIL/uL — ABNORMAL LOW (ref 3.87–5.11)
RDW: 12.5 % (ref 11.5–15.5)
WBC: 12.8 10*3/uL — AB (ref 4.0–10.5)

## 2013-12-23 MED ORDER — LACTATED RINGERS IV BOLUS (SEPSIS)
500.0000 mL | Freq: Once | INTRAVENOUS | Status: AC
  Administered 2013-12-23: 16:00:00 via INTRAVENOUS

## 2013-12-23 MED ORDER — MAGNESIUM SULFATE BOLUS VIA INFUSION
6.0000 g | Freq: Once | INTRAVENOUS | Status: AC
  Administered 2013-12-23: 6 g via INTRAVENOUS
  Filled 2013-12-23: qty 500

## 2013-12-23 MED ORDER — LACTATED RINGERS IV SOLN
INTRAVENOUS | Status: DC
  Administered 2013-12-23 – 2013-12-24 (×3): via INTRAVENOUS

## 2013-12-23 MED ORDER — MAGNESIUM SULFATE 40 G IN LACTATED RINGERS - SIMPLE
2.0000 g/h | INTRAVENOUS | Status: DC
  Filled 2013-12-23: qty 500

## 2013-12-23 NOTE — Progress Notes (Deleted)
Patient ID: Sallyanne KusterNorleen Bleicher, female   DOB: 05/23/1991, 23 y.o.   MRN: [id number] Pt became uncomfortable this PM Contractions are not printing out well. The RN did an exam at my request and the cervix is 1-2

## 2013-12-23 NOTE — Progress Notes (Signed)
On the phone with the patients interpreter and interpreter stating that she is is not answering her that she is only moaning.

## 2013-12-23 NOTE — Progress Notes (Signed)
Patient ID: [name] KusterNorleen [name], female   DOB: 05/21/1991, 23 y.o.   MRN: [id number] The pt became uncomfortable this PM Contractions do not print well. RN exam at my request cervix 1-2 cm 50 % effaced and US showed breech presentation. If the pt proceeds to deliver we have begun prophylaxis with Magnesium sulfate. I will proceed with c section only if FHR abnormalities occur or if there is definite cervical change.

## 2013-12-23 NOTE — Progress Notes (Signed)
Antenatal Nutrition Assessment:  Currently  24 1/[redacted] weeks gestation, with PROM. Height  4' 11 "  Weight 152  lbs  pre-pregnancy weight unknown by pt lbs .  Pre-pregnancy  BMI --  IBW 90-100 lbs Total weight gain --.lbs Weight gain goals 15-25  lbs Estimated needs: 16-1800 kcal/day, 65-75 grams protein/day, 1.9 liters fluid/day  Regular diet tolerated well, appetite good. Provided snack menu Current diet prescription will provide for increased needs. Family brings majority of food from home.  No abnormal nutrition related labs  Nutrition Dx: Increased nutrient needs r/t pregnancy and fetal growth requirements aeb [redacted] weeks gestation.  No educational needs assessed at this time.  Elisabeth CaraKatherine Dusan Lipford M.Odis LusterEd. R.D. LDN Neonatal Nutrition Support Specialist Pager [phone]

## 2013-12-23 NOTE — Progress Notes (Signed)
Pt assisted to the bathroom.

## 2013-12-23 NOTE — Progress Notes (Signed)
HD #8, [redacted]W[redacted]D, PPROM, breech Passed a tiny clot this am, nothing else new Afeb, VSS FHT remains looking good for 24 weeks Has 3 more days of Keflex, has finished Zithromax and received BMZ, continue observation

## 2013-12-23 NOTE — Progress Notes (Signed)
I visited with [name] [name] while making rounds on the unit.  She told me about her older children and about her family.  She also shared with me her fears about her baby and requested prayer for herself and her baby.  Centex CorporationChaplain Katy Kischa Altice Pager, [phone] 4:22 PM

## 2013-12-23 NOTE — Progress Notes (Signed)
Lilly patients interpreter was called by RN and disscussed with pt the POC.  Starting Magnesium sulfate and why and what the side effects are and the possibility of a Cesarean if she progressed with labor.  Pt informed that she was 1.5-2cm dilated and RN offered to answer any questions that the pt may have.

## 2013-12-23 NOTE — Progress Notes (Signed)
Pt taken off the monitor after reassuring FHR.  Pt tearful while talking on the phone with family.  Pt denies pain at this time.

## 2013-12-23 NOTE — Progress Notes (Signed)
Pt changing her position

## 2013-12-23 NOTE — Progress Notes (Signed)
PC from bedside from pt interpreter Lilly.  When asked if contractions any better she tells lilly that they are the same

## 2013-12-24 NOTE — Progress Notes (Signed)
Patient ID: [name] KusterNorleen [name], female   DOB: 07/11/1991, 23 y.o.   MRN: [id number]  On Magnesium Sulfate overnight  No more contractions, some pain per pt LLQ.  +FM, cont LOF, no VB, occ ctx.  AFVSS gen NAD Abd soft, FNT, LLQ no ttp, but states has pain where round ligament pain usually is  FHT 140's, app for gestational age toco rare  Turned Magnesium off, not likely imminent delivery, d/w pt.  Voices understanding.  Will continue to monitor closely.

## 2013-12-24 NOTE — Progress Notes (Signed)
Pt up to bathroom to void. Gait steady.

## 2013-12-24 NOTE — Progress Notes (Signed)
Patient assisted to bathroom to void. Gait steady.

## 2013-12-25 ENCOUNTER — Encounter (HOSPITAL_COMMUNITY): Payer: Medicaid Other | Admitting: Anesthesiology

## 2013-12-25 ENCOUNTER — Inpatient Hospital Stay (HOSPITAL_COMMUNITY): Payer: Medicaid Other | Admitting: Anesthesiology

## 2013-12-25 ENCOUNTER — Encounter (HOSPITAL_COMMUNITY): Payer: Self-pay | Admitting: Obstetrics and Gynecology

## 2013-12-25 ENCOUNTER — Encounter (HOSPITAL_COMMUNITY)
Admission: AD | Disposition: A | Discharge status: Home / Self Care | Payer: Self-pay | Source: Ambulatory Visit | Attending: Obstetrics and Gynecology

## 2013-12-25 DIAGNOSIS — Z98891 History of uterine scar from previous surgery: Secondary | ICD-10-CM

## 2013-12-25 DIAGNOSIS — O321XX0 Maternal care for breech presentation, not applicable or unspecified: Principal | ICD-10-CM

## 2013-12-25 HISTORY — DX: History of uterine scar from previous surgery: Z98.891

## 2013-12-25 HISTORY — DX: Maternal care for breech presentation, not applicable or unspecified: O32.1XX0

## 2013-12-25 SURGERY — Surgical Case
Anesthesia: Spinal | Site: Abdomen

## 2013-12-25 MED ORDER — ACETAMINOPHEN 160 MG/5ML PO SOLN
975.0000 mg | Freq: Four times a day (QID) | ORAL | Status: DC | PRN
  Filled 2013-12-25: qty 40

## 2013-12-25 MED ORDER — SIMETHICONE 80 MG PO CHEW: 80.0000 mg | CHEWABLE_TABLET | ORAL | Status: DC | PRN

## 2013-12-25 MED ORDER — SCOPOLAMINE 1 MG/3DAYS TD PT72
1.0000 | MEDICATED_PATCH | Freq: Once | TRANSDERMAL | Status: DC
  Administered 2013-12-25: 1.5 mg via TRANSDERMAL

## 2013-12-25 MED ORDER — PHENYLEPHRINE 8 MG IN D5W 100 ML (0.08MG/ML) PREMIX OPTIME
INJECTION | INTRAVENOUS | Status: DC | PRN
  Administered 2013-12-25: 60 ug/min via INTRAVENOUS

## 2013-12-25 MED ORDER — MEPERIDINE HCL 25 MG/ML IJ SOLN: 6.2500 mg | INTRAMUSCULAR | Status: DC | PRN

## 2013-12-25 MED ORDER — SENNOSIDES-DOCUSATE SODIUM 8.6-50 MG PO TABS
2.0000 | ORAL_TABLET | ORAL | Status: DC
  Administered 2013-12-25 – 2013-12-27 (×3): 2 via ORAL
  Filled 2013-12-25 (×3): qty 2

## 2013-12-25 MED ORDER — METOCLOPRAMIDE HCL 5 MG/ML IJ SOLN: 10.0000 mg | Freq: Three times a day (TID) | INTRAMUSCULAR | Status: DC | PRN

## 2013-12-25 MED ORDER — KETOROLAC TROMETHAMINE 30 MG/ML IJ SOLN
INTRAMUSCULAR | Status: AC
  Administered 2013-12-25: 30 mg via INTRAVENOUS
  Filled 2013-12-25: qty 1

## 2013-12-25 MED ORDER — ONDANSETRON HCL 4 MG PO TABS: 4.0000 mg | ORAL_TABLET | ORAL | Status: DC | PRN

## 2013-12-25 MED ORDER — CITRIC ACID-SODIUM CITRATE 334-500 MG/5ML PO SOLN
ORAL | Status: AC
  Administered 2013-12-25: 30 mL
  Filled 2013-12-25: qty 15

## 2013-12-25 MED ORDER — ZOLPIDEM TARTRATE 5 MG PO TABS: 5.0000 mg | ORAL_TABLET | Freq: Every evening | ORAL | Status: DC | PRN

## 2013-12-25 MED ORDER — NALBUPHINE HCL 10 MG/ML IJ SOLN: 5.0000 mg | INTRAMUSCULAR | Status: DC | PRN

## 2013-12-25 MED ORDER — PHENYLEPHRINE HCL 10 MG/ML IJ SOLN
INTRAMUSCULAR | Status: DC | PRN
  Administered 2013-12-25 (×4): 80 ug via INTRAVENOUS
  Administered 2013-12-25: 120 ug via INTRAVENOUS
  Administered 2013-12-25: 80 ug via INTRAVENOUS
  Administered 2013-12-25: 40 ug via INTRAVENOUS
  Administered 2013-12-25 (×2): 120 ug via INTRAVENOUS

## 2013-12-25 MED ORDER — FENTANYL CITRATE 0.05 MG/ML IJ SOLN
INTRAMUSCULAR | Status: AC
  Filled 2013-12-25: qty 2

## 2013-12-25 MED ORDER — OXYCODONE-ACETAMINOPHEN 5-325 MG PO TABS
1.0000 | ORAL_TABLET | ORAL | Status: DC | PRN
  Administered 2013-12-26: 1 via ORAL
  Filled 2013-12-25: qty 1

## 2013-12-25 MED ORDER — ONDANSETRON HCL 4 MG/2ML IJ SOLN
INTRAMUSCULAR | Status: DC | PRN
  Administered 2013-12-25: 4 mg via INTRAVENOUS

## 2013-12-25 MED ORDER — OXYTOCIN 40 UNITS IN LACTATED RINGERS INFUSION - SIMPLE MED: 62.5000 mL/h | INTRAVENOUS | Status: AC

## 2013-12-25 MED ORDER — METOCLOPRAMIDE HCL 5 MG/ML IJ SOLN: 10.0000 mg | Freq: Once | INTRAMUSCULAR | Status: DC | PRN

## 2013-12-25 MED ORDER — OXYTOCIN 10 UNIT/ML IJ SOLN
INTRAMUSCULAR | Status: AC
  Filled 2013-12-25: qty 4

## 2013-12-25 MED ORDER — DIBUCAINE 1 % RE OINT: 1.0000 "application " | TOPICAL_OINTMENT | RECTAL | Status: DC | PRN

## 2013-12-25 MED ORDER — ONDANSETRON HCL 4 MG/2ML IJ SOLN: 4.0000 mg | INTRAMUSCULAR | Status: DC | PRN

## 2013-12-25 MED ORDER — LANOLIN HYDROUS EX OINT: 1.0000 "application " | TOPICAL_OINTMENT | CUTANEOUS | Status: DC | PRN

## 2013-12-25 MED ORDER — SIMETHICONE 80 MG PO CHEW
80.0000 mg | CHEWABLE_TABLET | ORAL | Status: DC
  Administered 2013-12-25 – 2013-12-26 (×2): 80 mg via ORAL
  Filled 2013-12-25 (×3): qty 1

## 2013-12-25 MED ORDER — KETOROLAC TROMETHAMINE 30 MG/ML IJ SOLN: 30.0000 mg | Freq: Four times a day (QID) | INTRAMUSCULAR | Status: DC | PRN

## 2013-12-25 MED ORDER — SODIUM CHLORIDE 0.9 % IJ SOLN: 3.0000 mL | INTRAMUSCULAR | Status: DC | PRN

## 2013-12-25 MED ORDER — DIPHENHYDRAMINE HCL 50 MG/ML IJ SOLN: 12.5000 mg | INTRAMUSCULAR | Status: DC | PRN

## 2013-12-25 MED ORDER — WITCH HAZEL-GLYCERIN EX PADS: 1.0000 "application " | MEDICATED_PAD | CUTANEOUS | Status: DC | PRN

## 2013-12-25 MED ORDER — NALOXONE HCL 1 MG/ML IJ SOLN
1.0000 ug/kg/h | INTRAVENOUS | Status: DC | PRN
  Filled 2013-12-25: qty 2

## 2013-12-25 MED ORDER — DIPHENHYDRAMINE HCL 50 MG/ML IJ SOLN
INTRAMUSCULAR | Status: DC | PRN
  Administered 2013-12-25: 25 mg via INTRAVENOUS

## 2013-12-25 MED ORDER — FENTANYL CITRATE 0.05 MG/ML IJ SOLN: 25.0000 ug | INTRAMUSCULAR | Status: DC | PRN

## 2013-12-25 MED ORDER — TETANUS-DIPHTH-ACELL PERTUSSIS 5-2.5-18.5 LF-MCG/0.5 IM SUSP
0.5000 mL | Freq: Once | INTRAMUSCULAR | Status: AC
  Administered 2013-12-27: 0.5 mL via INTRAMUSCULAR
  Filled 2013-12-25: qty 0.5

## 2013-12-25 MED ORDER — PHENYLEPHRINE 40 MCG/ML (10ML) SYRINGE FOR IV PUSH (FOR BLOOD PRESSURE SUPPORT)
PREFILLED_SYRINGE | INTRAVENOUS | Status: AC
  Filled 2013-12-25: qty 10

## 2013-12-25 MED ORDER — MEPERIDINE HCL 25 MG/ML IJ SOLN
INTRAMUSCULAR | Status: AC
  Filled 2013-12-25: qty 1

## 2013-12-25 MED ORDER — MENTHOL 3 MG MT LOZG: 1.0000 | LOZENGE | OROMUCOSAL | Status: DC | PRN

## 2013-12-25 MED ORDER — KETOROLAC TROMETHAMINE 30 MG/ML IJ SOLN
30.0000 mg | Freq: Four times a day (QID) | INTRAMUSCULAR | Status: DC | PRN
  Administered 2013-12-25: 30 mg via INTRAVENOUS

## 2013-12-25 MED ORDER — DIPHENHYDRAMINE HCL 50 MG/ML IJ SOLN: 25.0000 mg | INTRAMUSCULAR | Status: DC | PRN

## 2013-12-25 MED ORDER — DIPHENHYDRAMINE HCL 25 MG PO CAPS
25.0000 mg | ORAL_CAPSULE | Freq: Four times a day (QID) | ORAL | Status: DC | PRN
  Filled 2013-12-25: qty 1

## 2013-12-25 MED ORDER — OXYTOCIN 10 UNIT/ML IJ SOLN
40.0000 [IU] | INTRAVENOUS | Status: DC | PRN
  Administered 2013-12-25: 40 [IU] via INTRAVENOUS

## 2013-12-25 MED ORDER — FENTANYL CITRATE 0.05 MG/ML IJ SOLN
INTRAMUSCULAR | Status: DC | PRN
  Administered 2013-12-25: 15 ug via INTRATHECAL

## 2013-12-25 MED ORDER — LACTATED RINGERS IV SOLN
INTRAVENOUS | Status: DC
  Administered 2013-12-25 (×2): via INTRAVENOUS

## 2013-12-25 MED ORDER — BUPIVACAINE IN DEXTROSE 0.75-8.25 % IT SOLN
INTRATHECAL | Status: DC | PRN
  Administered 2013-12-25: 1.3 mL via INTRATHECAL

## 2013-12-25 MED ORDER — ONDANSETRON HCL 4 MG/2ML IJ SOLN: 4.0000 mg | Freq: Three times a day (TID) | INTRAMUSCULAR | Status: DC | PRN

## 2013-12-25 MED ORDER — PRENATAL MULTIVITAMIN CH
1.0000 | ORAL_TABLET | Freq: Every day | ORAL | Status: DC
  Administered 2013-12-26 – 2013-12-27 (×2): 1 via ORAL
  Filled 2013-12-25 (×2): qty 1

## 2013-12-25 MED ORDER — ONDANSETRON HCL 4 MG/2ML IJ SOLN
INTRAMUSCULAR | Status: AC
  Filled 2013-12-25: qty 2

## 2013-12-25 MED ORDER — MEPERIDINE HCL 25 MG/ML IJ SOLN
INTRAMUSCULAR | Status: DC | PRN
  Administered 2013-12-25 (×2): 12.5 mg via INTRAVENOUS

## 2013-12-25 MED ORDER — DIPHENHYDRAMINE HCL 25 MG PO CAPS
25.0000 mg | ORAL_CAPSULE | ORAL | Status: DC | PRN
  Administered 2013-12-25: 25 mg via ORAL
  Filled 2013-12-25: qty 1

## 2013-12-25 MED ORDER — 0.9 % SODIUM CHLORIDE (POUR BTL) OPTIME
TOPICAL | Status: DC | PRN
  Administered 2013-12-25: 1000 mL

## 2013-12-25 MED ORDER — MORPHINE SULFATE (PF) 0.5 MG/ML IJ SOLN
INTRAMUSCULAR | Status: DC | PRN
  Administered 2013-12-25: .1 mg via INTRATHECAL

## 2013-12-25 MED ORDER — PHENYLEPHRINE 8 MG IN D5W 100 ML (0.08MG/ML) PREMIX OPTIME
INJECTION | INTRAVENOUS | Status: AC
  Filled 2013-12-25: qty 100

## 2013-12-25 MED ORDER — LACTATED RINGERS IV SOLN
INTRAVENOUS | Status: DC | PRN
  Administered 2013-12-25: 06:00:00 via INTRAVENOUS

## 2013-12-25 MED ORDER — NALOXONE HCL 0.4 MG/ML IJ SOLN: 0.4000 mg | INTRAMUSCULAR | Status: DC | PRN

## 2013-12-25 MED ORDER — SIMETHICONE 80 MG PO CHEW
80.0000 mg | CHEWABLE_TABLET | Freq: Three times a day (TID) | ORAL | Status: DC
  Administered 2013-12-25 – 2013-12-28 (×9): 80 mg via ORAL
  Filled 2013-12-25 (×8): qty 1

## 2013-12-25 MED ORDER — IBUPROFEN 800 MG PO TABS
800.0000 mg | ORAL_TABLET | Freq: Three times a day (TID) | ORAL | Status: DC
  Administered 2013-12-25 – 2013-12-28 (×9): 800 mg via ORAL
  Filled 2013-12-25 (×9): qty 1

## 2013-12-25 MED ORDER — DIPHENHYDRAMINE HCL 50 MG/ML IJ SOLN
INTRAMUSCULAR | Status: AC
  Filled 2013-12-25: qty 1

## 2013-12-25 MED ORDER — MORPHINE SULFATE 0.5 MG/ML IJ SOLN
INTRAMUSCULAR | Status: AC
  Filled 2013-12-25: qty 10

## 2013-12-25 MED ORDER — SCOPOLAMINE 1 MG/3DAYS TD PT72
MEDICATED_PATCH | TRANSDERMAL | Status: AC
  Filled 2013-12-25: qty 1

## 2013-12-25 SURGICAL SUPPLY — 40 items
BENZOIN TINCTURE PRP APPL 2/3 (GAUZE/BANDAGES/DRESSINGS) ×3 IMPLANT
CLAMP CORD UMBIL (MISCELLANEOUS) ×3 IMPLANT
CLOSURE WOUND 1/2 X4 (GAUZE/BANDAGES/DRESSINGS) ×1
CLOTH BEACON ORANGE TIMEOUT ST (SAFETY) ×3 IMPLANT
CONTAINER PREFILL 10% NBF 15ML (MISCELLANEOUS) IMPLANT
DRAPE LG THREE QUARTER DISP (DRAPES) ×3 IMPLANT
DRSG OPSITE POSTOP 4X10 (GAUZE/BANDAGES/DRESSINGS) ×3 IMPLANT
DURAPREP 26ML APPLICATOR (WOUND CARE) ×3 IMPLANT
ELECT REM PT RETURN 9FT ADLT (ELECTROSURGICAL) ×3
ELECTRODE REM PT RTRN 9FT ADLT (ELECTROSURGICAL) ×1 IMPLANT
EXTRACTOR VACUUM M CUP 4 TUBE (SUCTIONS) IMPLANT
EXTRACTOR VACUUM M CUP 4' TUBE (SUCTIONS)
GLOVE BIO SURGEON STRL SZ 6.5 (GLOVE) ×2 IMPLANT
GLOVE BIO SURGEONS STRL SZ 6.5 (GLOVE) ×1
GOWN STRL REUS W/ TWL XL LVL3 (GOWN DISPOSABLE) ×1 IMPLANT
GOWN STRL REUS W/TWL LRG LVL3 (GOWN DISPOSABLE) ×3 IMPLANT
GOWN STRL REUS W/TWL XL LVL3 (GOWN DISPOSABLE) ×2
KIT ABG SYR 3ML LUER SLIP (SYRINGE) ×3 IMPLANT
NEEDLE HYPO 25X5/8 SAFETYGLIDE (NEEDLE) ×3 IMPLANT
NS IRRIG 1000ML POUR BTL (IV SOLUTION) ×3 IMPLANT
PACK C SECTION WH (CUSTOM PROCEDURE TRAY) ×3 IMPLANT
PAD OB MATERNITY 4.3X12.25 (PERSONAL CARE ITEMS) ×3 IMPLANT
RTRCTR C-SECT PINK 25CM LRG (MISCELLANEOUS) ×3 IMPLANT
SPONGE SURGIFOAM ABS GEL 12-7 (HEMOSTASIS) ×3 IMPLANT
STAPLER VISISTAT 35W (STAPLE) IMPLANT
STRIP CLOSURE SKIN 1/2X4 (GAUZE/BANDAGES/DRESSINGS) ×2 IMPLANT
SUT MNCRL 0 VIOLET CTX 36 (SUTURE) ×2 IMPLANT
SUT MONOCRYL 0 CTX 36 (SUTURE) ×4
SUT PLAIN 1 NONE 54 (SUTURE) IMPLANT
SUT PLAIN 2 0 XLH (SUTURE) ×3 IMPLANT
SUT VIC AB 0 CT1 27 (SUTURE) ×4
SUT VIC AB 0 CT1 27XBRD ANBCTR (SUTURE) ×2 IMPLANT
SUT VIC AB 2-0 CT1 27 (SUTURE) ×2
SUT VIC AB 2-0 CT1 TAPERPNT 27 (SUTURE) ×1 IMPLANT
SUT VIC AB 3-0 PS2 18 (SUTURE) ×6 IMPLANT
SUT VIC AB 4-0 KS 27 (SUTURE) ×3 IMPLANT
SYR BULB IRRIGATION 50ML (SYRINGE) ×3 IMPLANT
TOWEL OR 17X24 6PK STRL BLUE (TOWEL DISPOSABLE) ×3 IMPLANT
TRAY FOLEY CATH 14FR (SET/KITS/TRAYS/PACK) ×3 IMPLANT
WATER STERILE IRR 1000ML POUR (IV SOLUTION) IMPLANT

## 2013-12-25 NOTE — Transfer of Care (Signed)
Immediate Anesthesia Transfer of Care Note  Patient: [name] [name]  Procedure(s) Performed: Procedure(s): Primary Cesarean Section Delivery Baby Girl @ 0542, Apgars (N/A)  Patient Location: PACU  Anesthesia Type:Spinal  Level of Consciousness: awake, alert , oriented and patient cooperative  Airway & Oxygen Therapy: Patient Spontanous Breathing  Post-op Assessment: Report given to PACU RN, Post -op Vital signs reviewed and stable and Patient moving all extremities X 4  Post vital signs: Reviewed and stable  Complications: No apparent anesthesia complications

## 2013-12-25 NOTE — Progress Notes (Signed)
I provided follow-up care to [facility] and [facility] husband.  He was positive, hopeful and thankful for his baby girl.  It was more difficult to get an emotional read on Yanilen because she was very quiet when he was in the room, which could be a cultural norm for them.  He requested prayer and I offered prayer for them and for their baby and family.  We will continue to follow up with her and try to find a time to speak with her alone to see how she is doing emotionally.  Centex CorporationChaplain Katy Cleve Paolillo Pager, [phone] 10:26 AM   12/25/13 1000  Clinical Encounter Type  Visited With Patient and family together  Visit Type Spiritual support  Spiritual Encounters  Spiritual Needs Emotional;Prayer

## 2013-12-25 NOTE — Progress Notes (Signed)
[id number] - Patient called out to desk d/t pain. Patient states she feels pain in lower abdomen and feels like she needs to have a BM.  0500- EFM and toco applied.  [id number] - Dr. Ellyn HackBovard paged. 0510 - SVE done, fetal parts felt in vagina.  [id number] - Dr. Ellyn HackBovard on phone, updated on patient status, on her way to hospital 0520 - Dr. Ellyn HackBovard at bedside, SVE performed. Bedside u/s confirmed breech, preparing for OR 0521 - Patient clipped, nasal swab done, CHG bath, jewelry removed. FOB dressed for OR 0523 - FHR baseline 145 0526 - Pt transferred to OR via bed.

## 2013-12-25 NOTE — Progress Notes (Signed)
12/25/13 1600  Clinical Encounter Type  Visited With Patient  Visit Type Spiritual support;Social support  Spiritual Encounters  Spiritual Needs Prayer;Emotional   Followed up with Angellynn on WU to offer care, support, and prayer.  Also prayed over baby in NICU, per request.  Consulted with RNs on WU and in NICU re supporting family.  Shade is very receptive to prayer; please page [phone] at any time for pastoral and prayer support.  Thank you!  [address]Chaplain Safwan Tomei ArcherLundeen, [place] [phone]

## 2013-12-25 NOTE — Anesthesia Procedure Notes (Signed)
Spinal  Patient location during procedure: OR Start time: 12/25/2013 5:33 AM Staffing Performed by: anesthesiologist  Preanesthetic Checklist Completed: patient identified, site marked, surgical consent, pre-op evaluation, timeout performed, IV checked, risks and benefits discussed and monitors and equipment checked Spinal Block Patient position: right lateral decubitus Prep: Betasept, site prepped and draped and DuraPrep Patient monitoring: heart rate, continuous pulse ox and blood pressure Approach: midline Location: L3-4 Injection technique: single-shot Needle Needle type: Pencan  Needle gauge: 24 G Needle length: 10 cm Assessment Sensory level: T4 Additional Notes Clear free flow CSF on first attempt. No paresthesia.  Patient tolerated procedure well with no apparent complications.  Jasmine DecemberA. Cassidy, MD

## 2013-12-25 NOTE — Progress Notes (Signed)
I had a chance to speak with [name] [name] on her own and check in on her understanding of the situation.  She is aware of the critical condition of her daughter and she is coping as well as she can.  She is very responsive to prayer and has requested prayer at each visit.  I spoke with NICU RN as well as physician to let them know of 24/7 chaplain availability if baby's condition changes.  Please page as needs arise.  Centex CorporationChaplain Katy Waldon Sheerin Pager, [phone] 3:12 PM   12/25/13 1500  Clinical Encounter Type  Visited With Patient  Visit Type Spiritual support  Spiritual Encounters  Spiritual Needs Emotional

## 2013-12-25 NOTE — Anesthesia Postprocedure Evaluation (Signed)
  Anesthesia Post-op Note  Patient: [name] KusterNorleen [name]  Procedure(s) Performed: Procedure(s): Primary Cesarean Section Delivery Baby Girl @ 0542, Apgars (N/A)  Patient Location: PACU  Anesthesia Type:Spinal  Level of Consciousness: awake, alert  and oriented  Airway and Oxygen Therapy: Patient Spontanous Breathing  Post-op Pain: none  Post-op Assessment: Post-op Vital signs reviewed, Patient's Cardiovascular Status Stable, Respiratory Function Stable, Patent Airway, No signs of Nausea or vomiting, Pain level controlled, No headache and No backache  Post-op Vital Signs: Reviewed and stable  Complications: No apparent anesthesia complications

## 2013-12-25 NOTE — Brief Op Note (Signed)
12/16/2013 - 12/25/2013  7:03 AM  PATIENT:  [name] KusterNorleen [name]  22 y.o. female  PRE-OPERATIVE DIAGNOSIS:  Breech, Labor, IUP @ 24+  POST-OPERATIVE DIAGNOSIS:  Breech, Labor, IUP @ 24+, delivered  PROCEDURE:  Procedure(s): Primary Cesarean Section Delivery Baby Girl @ 0542, Apgars (1,3,4) weight 819g, low vertical cesarean section  SURGEON:  Surgeon(s) and Role:    * Sherron MondayJody Bovard, MD - Primary  ANESTHESIA:   spinal  EBL:   500cc  IVF: 2500 uop: 100cc, clear  FINDINGS: viable female infant in frank breech presentation, nl uterus, tubes and ovaries.  BLOOD ADMINISTERED:none  DRAINS: Urinary Catheter (Foley)   LOCAL MEDICATIONS USED:  NONE  SPECIMEN:  Source of Specimen:  Placenta  DISPOSITION OF SPECIMEN:  PATHOLOGY  COUNTS:  YES  TOURNIQUET:  * No tourniquets in log *  DICTATION: .Other Dictation: Dictation Number [id number]  PLAN OF CARE: Admit to inpatient   PATIENT DISPOSITION:  PACU - hemodynamically stable.   Delay start of Pharmacological VTE agent (>24hrs) due to surgical blood loss or risk of bleeding: not applicable

## 2013-12-25 NOTE — Anesthesia Preprocedure Evaluation (Signed)
Anesthesia Evaluation  Patient identified by MRN, date of birth, ID band Patient awake    Reviewed: Allergy & Precautions, H&P , NPO status , Patient's Chart, lab work & pertinent test results, reviewed documented beta blocker date and time   History of Anesthesia Complications Negative for: history of anesthetic complications  Airway Mallampati: I TM Distance: >3 FB Neck ROM: full    Dental  (+) Teeth Intact   Pulmonary neg pulmonary ROS,  breath sounds clear to auscultation        Cardiovascular negative cardio ROS  Rhythm:regular Rate:Normal     Neuro/Psych negative neurological ROS  negative psych ROS   GI/Hepatic negative GI ROS, Neg liver ROS,   Endo/Other  negative endocrine ROS  Renal/GU negative Renal ROS     Musculoskeletal   Abdominal   Peds  Hematology  (+) anemia ,   Anesthesia Other Findings Last ate at 10 pm  Reproductive/Obstetrics (+) Pregnancy (24.6 weeks, SROM, breech --> STAT C/S)                           Anesthesia Physical  Anesthesia Plan  ASA: II and emergent  Anesthesia Plan: Spinal   Post-op Pain Management:    Induction:   Airway Management Planned:   Additional Equipment:   Intra-op Plan:   Post-operative Plan:   Informed Consent: I have reviewed the patients History and Physical, chart, labs and discussed the procedure including the risks, benefits and alternatives for the proposed anesthesia with the patient or authorized representative who has indicated his/her understanding and acceptance.   Dental Advisory Given  Plan Discussed with: Surgeon and CRNA  Anesthesia Plan Comments: (Per Dr Ellyn HackBovard, wants pt to understand what will happen in event of emergent C/S.  Discussed spinal vs GA in the event of urgent/emergent C/S with interpreter via phone (pts sister-in-law is the interpreter, no interpreter for her language via [place]).  )         Anesthesia Quick Evaluation

## 2013-12-25 NOTE — Lactation Note (Signed)
This note was copied from the chart of Girl Sallyanne KusterNorleen Mathieson. Lactation Consultation Note Breastfeeding consultation and support information along with Providing Breast milk for your Baby in NICU given to parents.  Mom speaks limited English so unsure of how much teaching mother comprehended .  FOB speaks English but he was sleeping.  DEBP and hand expression initiatd.  Assisted mom with pumping on preemie setting for 15 minutes followed by hand expression.  Mom obtained one drop of colostrum.  Mom states she breast and formula fed her last baby x 1 year.  She has a 701 and 23 year old at home.  Baby's condition is critical.  Patient Name: Girl Sallyanne Kusterorleen Genter AVWUJ'WToday's Date: 12/25/2013 Reason for consult: Initial assessment;NICU baby;Infant < 6lbs   Maternal Data    Feeding    LATCH Score/Interventions                      Lactation Tools Discussed/Used     Consult Status Consult Status: Follow-up Date: 12/26/13 Follow-up type: In-patient    Hansel Feinsteinowell, Gusta Marksberry Ann 12/25/2013, 11:58 AM

## 2013-12-25 NOTE — Anesthesia Postprocedure Evaluation (Signed)
Anesthesia Post Note  Patient: [name] KusterNorleen Huisman  Procedure(s) Performed: Procedure(s) (LRB): Primary Cesarean Section Delivery Baby Girl @ 0542, Apgars (N/A)  Anesthesia type: spinal  Patient location: Mother/Baby  Post pain: Pain level controlled  Post assessment: Post-op Vital signs reviewed  Last Vitals:  Filed Vitals:   12/25/13 1315  BP: 96/55  Pulse: 81  Temp: 36.6 C  Resp: 18    Post vital signs: Reviewed  Level of consciousness:alert  Complications: No apparent anesthesia complications

## 2013-12-26 LAB — CBC
HCT: 28.5 % — ABNORMAL LOW (ref 36.0–46.0)
Hemoglobin: 9.6 g/dL — ABNORMAL LOW (ref 12.0–15.0)
MCH: 28.4 pg (ref 26.0–34.0)
MCHC: 33.7 g/dL (ref 30.0–36.0)
MCV: 84.3 fL (ref 78.0–100.0)
PLATELETS: 284 10*3/uL (ref 150–400)
RBC: 3.38 MIL/uL — AB (ref 3.87–5.11)
RDW: 12.8 % (ref 11.5–15.5)
WBC: 14.1 10*3/uL — AB (ref 4.0–10.5)

## 2013-12-26 LAB — BASIC METABOLIC PANEL WITH GFR
BUN: 7 mg/dL (ref 6–23)
CO2: 23 meq/L (ref 19–32)
Calcium: 8.4 mg/dL (ref 8.4–10.5)
Chloride: 104 meq/L (ref 96–112)
Creatinine, Ser: 0.48 mg/dL — ABNORMAL LOW (ref 0.50–1.10)
GFR calc Af Amer: >90 mL/min (ref >90)
GFR calc non Af Amer: >90 mL/min (ref >90)
Glucose, Bld: 94 mg/dL (ref 70–99)
Potassium: 3.7 meq/L (ref 3.7–5.3)
Sodium: 137 meq/L (ref 137–147)

## 2013-12-26 NOTE — Progress Notes (Signed)
Subjective: Postpartum Day #1: Cesarean Delivery Patient reports incisional pain, tolerating PO and no problems voiding.  Baby stable in NICU  Objective: Vital signs in last 24 hours: Temp:  [97.5 F (36.4 C)-98.3 F (36.8 C)] 97.5 F (36.4 C) (02/21 0125) Pulse Rate:  [58-81] 58 (02/21 0125) Resp:  [16-18] 18 (02/21 0510) BP: (86-96)/(39-55) 93/54 mmHg (02/21 0125) SpO2:  [95 %-99 %] 99 % (02/21 0510)  Physical Exam:  General: alert Lochia: appropriate Uterine Fundus: firm Incision: dressing C/D/I   Recent Labs  12/23/13 1718 12/26/13 0515  HGB 10.6* 9.6*  HCT 30.6* 28.5*    Assessment/Plan: Status post Cesarean section. Doing well postoperatively.  Continue current care, continue pumping, ambulate.  [name] [name] D 12/26/2013, 9:43 AM

## 2013-12-26 NOTE — Lactation Note (Signed)
This note was copied from the chart of [name] [name]. Lactation Consultation Note  Patient Name: [name] [name] [id number]'s Date: 12/26/2013 Reason for consult: Follow-up assessment  [facility] and remained on phone during consult.  LC spoke with dad.  Dad stated she is pumping every 3 hrs and is getting about 15-20 ml with each pumping session.  Reviewed the need to pump 8x/day (every 2 hrs during day and at least once during the night).  Mom does not have WIC.  Dad stated they plan to purchase a pump and asked questions about pump purchases.  Brochure given on DEBP and verbal information given about the need for a good DEBP upon discharge from hospital.  Dad appreciative of information and verbalized understanding.  Encouraged to call with questions as needed after discharge.     Maternal Data Formula Feeding for Exclusion: Yes Reason for exclusion: Admission to Intensive Care Unit (ICU) post-partum (Infant in NICU)   Lactation Tools Discussed/Used WIC Program: No   Consult Status Consult Status: Complete    [name] [name], [name] [name] 12/26/2013, 3:31 PM

## 2013-12-26 NOTE — Op Note (Signed)
[name] [name], [name] [name] NO.:  [id number]  MEDICAL RECORD NO.:  [id number]  LOCATION:  9309                          FACILITY:  [facility]  PHYSICIAN:  Sherron Monday, MD        DATE OF BIRTH:  01/20/1991  DATE OF PROCEDURE:  12/25/2013 DATE OF DISCHARGE:                              OPERATIVE REPORT   PREOPERATIVE DIAGNOSIS:  Breech, labor, intrauterine pregnancy at 24 weeks.  POSTOPERATIVE DIAGNOSIS:  Breech, labor, intrauterine pregnancy at 24 weeks, delivered.  PROCEDURE:  Low vertical cesarean section.  SURGEON:  Sherron Monday, MD  ANESTHESIA:  Spinal.  IV FLUIDS:  2500 mL.  URINE OUTPUT:  100 mL, clear urine at the end of the procedure.  EBL:  500 mL.  COMPLICATIONS:  None.  PATHOLOGY:  Placenta to Pathology.  FINDINGS:  Viable female infant at 5:42 a.m. with Apgars of 1, 3, and 4 and a weight of 819 g, delivered by low vertical incision.  PROCEDURE IN DETAIL:  After informed consent was reviewed with the patient and her interpreter which is her husband, this has been discussed with her at length due to the language barriers, previously noted this would likely be the course of delivery.  Informed consent reviewed.  Bleeding risk, damage, and infection as well as trouble healing, and injury to the infant; the patient and husband voiced understanding.  The patient was transferred to the OR where a spinal was placed when she was lying on the side.  She was returned to supine position with a leftward tilt.  Pfannenstiel skin incision was made at the level of 2 fingerbreadths above the pubic symphysis, carried through sharply to the fascia.  The fascia was incised in the midline.  The incision was extended laterally with Mayo scissors.  The superior aspect of the fascial incision was grasped with Kocher clamps,  elevating the rectus muscles, dissected off bluntly.  A midline was easily visualized. Peritoneum was entered bluntly.  Alexis skin retractor was  placed carefully making sure no bowel was entrapped.  The uterus was then inspected and the decision was made to proceed with a low vertical incision given that her lower uterine segment was not developed. Infant's legs, back, and arms were easily delivered.  There was some head entrapment, then the incision was extended somewhat vertically with bandage scissors.  Infant was delivered and handed off to the awaiting pediatric staff immediately.  The uterus was cleared of all clot and debris.  The uterine incision was closed with 2 layers of 0 Monocryl, the first of which was running locked and the second as an imbricating layer.  There was an additional serosal layer with 3-0 Vicryl as there was diffuse oozing, both pressure and Gelfoam were used to obtain hemostasis.  Gutters were cleared of all clot and debris.  Tubes and ovaries were inspected.  The Alexis skin retractor was removed. Peritoneum was reapproximated with 2-0 Vicryl in a running fashion. Subfascial plane was inspected and found to be hemostatic.  The fascia was closed in a single layer of 0 Vicryl with a running stitch. Subcuticular adipose layer was made hemostatic with Bovie cautery.  Dead space was closed  with 3-0 plain gut.  Skin was closed with 4-0 Vicryl in subcuticular fashion with a Mellody DanceKeith needle.  Benzoin and Steri-Strips were applied.  Sponge, lap, and needle count was correct x2 per the operating room staff.  The patient tolerated the procedure well.     Sherron MondayJody Bovard, MD     JB/MEDQ  D:  12/25/2013  T:  12/25/2013  Job:  [id number]

## 2013-12-27 MED ORDER — MEASLES, MUMPS & RUBELLA VAC ~~LOC~~ INJ
0.5000 mL | INJECTION | Freq: Once | SUBCUTANEOUS | Status: AC
  Administered 2013-12-28: 0.5 mL via SUBCUTANEOUS
  Filled 2013-12-27 (×2): qty 0.5

## 2013-12-27 NOTE — Progress Notes (Signed)
Clinical Social Work Department PSYCHOSOCIAL ASSESSMENT - MATERNAL/CHILD 09-May-2014  Patient:  Park Pope  Account Number:  [id number]  [place] Date:  Mar 15, 2014  Ardine Eng Name:   Janie Morning    Clinical Social Worker:  Warrick Llera, LCSW   Date/Time:  2014/10/02 12:30 PM  Date Referred:  06-13-14   Referral source  NICU     Referred reason  NICU   Other referral source:    I:  FAMILY / [place] legal guardian:  PARENT  Guardian - Name Guardian - Age Guardian - Address  [place],Aalliyah [address]  [place], [place] [address]  Clovis Riley  same as above   Other household support members/support persons Other support:    II  PSYCHOSOCIAL DATA Information Source:    Occupational hygienist Employment:   Financial resources:  [employer] [place]:   Librarian, academic  Provided parents with information to apply for [employer] / Grade:   Maternity Care Coordinator / Child Services Coordination / Early Interventions:  Cultural issues impacting care:    III  STRENGTHS Strengths  Supportive family/friends  Home prepared for Child (including basic supplies)  Adequate Resources   Strength comment:    IV  RISK FACTORS AND CURRENT PROBLEMS Current Problem:       V  SOCIAL WORK ASSESSMENT Met with both parents.  They were pleasant and receptive to social work intervention.  Parents are not married.  They cohabitate and have two other dependents age 11 and 13. Father is employed and mother is a stay at home mom. Parents state that they have spoken with the NICU team and feels comfortable with the care she is receiving. Parents communicate hopes that infant will continue to do well.   Mother denies any hx of substance abuse or mental illness.  Spoke with parents regarding the SSI program and they both were receptive.  Initial paperwork completed. No acute social concerns related at this time.  Parents state that they will  reach out to family to assist them with transportation to visit with newborn.  Parents informed of social work Fish farm manager.      VI SOCIAL WORK PLAN Social Work Plan  Psychosocial Support/Ongoing Assessment of Needs   Type of pt/family education:   If child protective services report - county:   If child protective services report - date:   Information/referral to community resources comment:   SSI information/referral

## 2013-12-27 NOTE — Progress Notes (Deleted)
Clinical Social Work Department PSYCHOSOCIAL ASSESSMENT - MATERNAL/CHILD 12/27/2013  Patient:  [name] [name], [name] [name]  Account Number:  [id number]  Admit Date:  12/19/2013  [name] [name] [name]:   [name] [name] [name]    Clinical Social Worker:  [name] Gagliano, LCSW   Date/Time:  12/27/2013 11:00 AM  Date Referred:  12/27/2013      Referred reason  NICU   Other referral source:    I:  FAMILY / [place] legal guardian:  PARENT  Guardian - [name] - Age Guardian - Address  [place] [address] [place], [place] [address]  Keturah Barre  same as above   Other household support members/support persons Other support:    II  PSYCHOSOCIAL DATA Information Source:    Occupational hygienist Employment:   Museum/gallery curator resources:  [employer] [place]:   Other  [place] / Grade:   Maternity Care Coordinator / Child Services Coordination / Early Interventions:  Cultural issues impacting care:    III  STRENGTHS Strengths  Supportive family/friends  Home prepared for Child (including basic supplies)  Compliance with medical plan  Adequate Resources  Understanding of illness   Strength comment:    IV  RISK FACTORS AND CURRENT PROBLEMS Current Problem:       V  SOCIAL WORK ASSESSMENT Met with both parents.  They were pleasant and receptive to social work intervention.  Maternal grandmother also present and very attentive to mother.  Parents are married. They have one other dependent age 23.   Father is employed and mother is a stay at home mom.  Parents state that they have spoken with the NICU team and newborn is doing remarkable well given his prematurity.   Grandmother states that many have been praying for him.    Parents communicate hopes that infant will continue to do well.   Mother denies any hx of substance abuse or mental illness.  Spoke with parents regarding the SSI program and they both were receptive.  Initial paperwork  completed.   No concerns with transportation noted.   No acute social concerns related at this time.  Parents informed of social work Fish farm manager.      VI SOCIAL WORK PLAN Social Work Plan  Psychosocial Support/Ongoing Assessment of Needs   Type of pt/family education:   If child protective services report - county:   If child protective services report - date:   Information/referral to community resources comment:   SSI information/Referral

## 2013-12-27 NOTE — Progress Notes (Signed)
Visit as follow up to earlier Chaplain visits. The family states they are doing well and thanks the chaplains for their care.  Will visit NICU to pray for baby.  [name] [name], DMin Chaplain

## 2013-12-28 ENCOUNTER — Encounter (HOSPITAL_COMMUNITY): Payer: Self-pay | Admitting: Obstetrics and Gynecology

## 2013-12-28 MED ORDER — PRENATAL MULTIVITAMIN CH: 1.0000 | ORAL_TABLET | Freq: Every day | ORAL | Status: DC

## 2013-12-28 MED ORDER — OXYCODONE-ACETAMINOPHEN 5-325 MG PO TABS: 1.0000 | ORAL_TABLET | Freq: Four times a day (QID) | ORAL | Status: DC | PRN

## 2013-12-28 MED ORDER — IBUPROFEN 800 MG PO TABS: 800.0000 mg | ORAL_TABLET | Freq: Three times a day (TID) | ORAL | Status: DC

## 2013-12-28 NOTE — Progress Notes (Signed)
Subjective: Postpartum Day 3: Cesarean Delivery Patient reports incisional pain and tolerating PO.  Nl lochia, pain controlled  Objective: Vital signs in last 24 hours: Temp:  [97.6 F (36.4 C)-98.2 F (36.8 C)] 97.6 F (36.4 C) (02/23 0513) Pulse Rate:  [70-87] 70 (02/23 0513) Resp:  [18-19] 18 (02/23 0513) BP: (90-109)/(54-66) 92/55 mmHg (02/23 0513) SpO2:  [98 %-100 %] 100 % (02/23 0513)  Physical Exam:  General: alert and no distress Lochia: appropriate Uterine Fundus: firm Incision: healing well DVT Evaluation: No evidence of DVT seen on physical exam.   Recent Labs  12/26/13 0515  HGB 9.6*  HCT 28.5*    Assessment/Plan: Status post Cesarean section. Doing well postoperatively.  Routine care.   Discharge home with standard precautions and return to clinic in 2 weeks.  Baby stable in NICU.  D/C with Motrin, percocet and PNV.     BOVARD,Eagle Pitta 12/28/2013, 8:07 AM

## 2013-12-28 NOTE — Discharge Summary (Signed)
Obstetric Discharge Summary Reason for Admission: PPROM, limited/no prenatal care Prenatal Procedures: NST and ultrasound, Betamethasone, IV abx Intrapartum Procedures: cesarean: low cervical, vertical Postpartum Procedures: none Complications-Operative and Postpartum: none Hemoglobin  Date Value Ref Range Status  12/26/2013 9.6* 12.0 - 15.0 g/dL Final     HCT  Date Value Ref Range Status  12/26/2013 28.5* 36.0 - 46.0 % Final    Physical Exam:  General: alert and no distress Lochia: appropriate Uterine Fundus: firm Incision: healing well DVT Evaluation: No evidence of DVT seen on physical exam.  Discharge Diagnoses: PPROM and 24 wk delivery  Discharge Information: Date: 12/28/2013 Activity: pelvic rest Diet: routine Medications: PNV, Ibuprofen and Percocet Condition: stable Instructions: refer to practice specific booklet Discharge to: home Follow-up Information   Follow up with BOVARD,Charlotta Lapaglia, MD. Schedule an appointment as soon as possible for a visit in 2 weeks. (Incision Check, 6 wks full postpartum check)    Specialty:  Obstetrics and Gynecology   Contact information:   [address] [place] [address] [phone]       Newborn Data: Live born female  Birth Weight: 1 lb 12.9 oz (819 g) APGAR: 1, 3  Baby in NICU, on vent, stable  BOVARD,Markitta Ausburn 12/28/2013, 8:14 AM

## 2013-12-28 NOTE — Progress Notes (Signed)
Ur chart review completed.  

## 2013-12-28 NOTE — Progress Notes (Signed)
Pt is discharged in the care of husband. Downstairs with R>N. Escort. Denies any pain or discomfort. Abdominal Dressing clean and intact. Small amt of bleeding noted on vaginal pad Discharged instructions with Rx were given to pt. Questions were asked and answered. Infant to remain in [facility].

## 2013-12-29 ENCOUNTER — Encounter (HOSPITAL_COMMUNITY): Payer: Self-pay | Admitting: *Deleted

## 2014-04-05 NOTE — Clinical Documentation Improvement (Signed)
Chart accessed for purpose of chart audit in reference to [place] [employer].  

## 2014-04-29 ENCOUNTER — Ambulatory Visit: Payer: Self-pay

## 2014-04-29 NOTE — Lactation Note (Signed)
This note was copied from the chart of [name] [name]. Lactation Consultation Note   Follow up consult with this mom of a nICU baby, now 724 months old, and 42 2/7 weeks CGA. The baby is still on 5 Liters HFNC. Mom wants to breast feed , but would need to increase her milk supply in order to do this. I advised her to pump at least 8 times a day, around the clock, stay hydrated, and I gave her information on Fenugreek and moringa.  Patient Name: [name] ZOXWR'UToday's Date: 04/29/2014 Reason for consult: Follow-up assessment;NICU baby   Maternal Data    Feeding Feeding Type: Breast Milk with Formula added Length of feed: 30 min  LATCH Score/Interventions                      Lactation Tools Discussed/Used     Consult Status Consult Status: Follow-up Follow-up type: In-patient (NICU)    Alfred LevinsLee, Christine Anne 04/29/2014, 3:38 PM

## 2014-09-06 ENCOUNTER — Encounter (HOSPITAL_COMMUNITY): Payer: Self-pay | Admitting: *Deleted

## 2015-12-14 ENCOUNTER — Encounter (HOSPITAL_COMMUNITY)
Admission: AD | Disposition: A | Discharge status: Home / Self Care | Payer: Self-pay | Source: Ambulatory Visit | Attending: Obstetrics & Gynecology

## 2015-12-14 ENCOUNTER — Inpatient Hospital Stay (HOSPITAL_COMMUNITY): Payer: Medicaid Other | Admitting: Certified Registered Nurse Anesthetist

## 2015-12-14 ENCOUNTER — Inpatient Hospital Stay (HOSPITAL_COMMUNITY)
Admission: AD | Admit: 2015-12-14 | Discharge: 2015-12-16 | DRG: 765 | Disposition: A | Discharge status: Home / Self Care | Payer: Medicaid Other | Source: Ambulatory Visit | Attending: Obstetrics & Gynecology | Admitting: Obstetrics & Gynecology

## 2015-12-14 ENCOUNTER — Encounter (HOSPITAL_COMMUNITY): Payer: Self-pay | Admitting: *Deleted

## 2015-12-14 DIAGNOSIS — O34212 Maternal care for vertical scar from previous cesarean delivery: Secondary | ICD-10-CM | POA: Diagnosis present

## 2015-12-14 DIAGNOSIS — Z603 Acculturation difficulty: Secondary | ICD-10-CM

## 2015-12-14 DIAGNOSIS — O321XX0 Maternal care for breech presentation, not applicable or unspecified: Secondary | ICD-10-CM | POA: Diagnosis present

## 2015-12-14 DIAGNOSIS — Z98891 History of uterine scar from previous surgery: Secondary | ICD-10-CM

## 2015-12-14 DIAGNOSIS — O329XX0 Maternal care for malpresentation of fetus, unspecified, not applicable or unspecified: Secondary | ICD-10-CM | POA: Diagnosis present

## 2015-12-14 DIAGNOSIS — Z3A36 36 weeks gestation of pregnancy: Secondary | ICD-10-CM | POA: Diagnosis not present

## 2015-12-14 DIAGNOSIS — O09899 Supervision of other high risk pregnancies, unspecified trimester: Secondary | ICD-10-CM

## 2015-12-14 DIAGNOSIS — O09219 Supervision of pregnancy with history of pre-term labor, unspecified trimester: Secondary | ICD-10-CM

## 2015-12-14 LAB — TYPE AND SCREEN
ABO/RH(D): O POS
Antibody Screen: NEGATIVE

## 2015-12-14 LAB — CBC
HCT: 34.2 % — ABNORMAL LOW (ref 36.0–46.0)
Hemoglobin: 11.8 g/dL — ABNORMAL LOW (ref 12.0–15.0)
MCH: 29.5 pg (ref 26.0–34.0)
MCHC: 34.5 g/dL (ref 30.0–36.0)
MCV: 85.5 fL (ref 78.0–100.0)
PLATELETS: 211 10*3/uL (ref 150–400)
RBC: 4 MIL/uL (ref 3.87–5.11)
RDW: 12.6 % (ref 11.5–15.5)
WBC: 11.9 10*3/uL — ABNORMAL HIGH (ref 4.0–10.5)

## 2015-12-14 LAB — WET PREP, GENITAL
CLUE CELLS WET PREP: NONE SEEN
Sperm: NONE SEEN
TRICH WET PREP: NONE SEEN
Yeast Wet Prep HPF POC: NONE SEEN

## 2015-12-14 LAB — RAPID HIV SCREEN (HIV 1/2 AB+AG)
HIV 1/2 Antibodies: NONREACTIVE
HIV-1 P24 Antigen - HIV24: NONREACTIVE

## 2015-12-14 LAB — DIFFERENTIAL
Basophils Absolute: 0 10*3/uL (ref 0.0–0.1)
Basophils Relative: 0 %
Eosinophils Absolute: 0.3 10*3/uL (ref 0.0–0.7)
Eosinophils Relative: 2 %
LYMPHS ABS: 1.7 10*3/uL (ref 0.7–4.0)
LYMPHS PCT: 15 %
MONOS PCT: 4 %
Monocytes Absolute: 0.5 10*3/uL (ref 0.1–1.0)
NEUTROS ABS: 9.4 10*3/uL — AB (ref 1.7–7.7)
Neutrophils Relative %: 79 %

## 2015-12-14 LAB — HEPATITIS B SURFACE ANTIGEN: HEP B S AG: NEGATIVE

## 2015-12-14 SURGERY — Surgical Case
Anesthesia: Spinal

## 2015-12-14 MED ORDER — TETANUS-DIPHTH-ACELL PERTUSSIS 5-2.5-18.5 LF-MCG/0.5 IM SUSP: 0.5000 mL | Freq: Once | INTRAMUSCULAR | Status: DC

## 2015-12-14 MED ORDER — MEPERIDINE HCL 25 MG/ML IJ SOLN: 6.2500 mg | INTRAMUSCULAR | Status: DC | PRN

## 2015-12-14 MED ORDER — PROMETHAZINE HCL 25 MG/ML IJ SOLN
6.2500 mg | INTRAMUSCULAR | Status: DC | PRN
  Filled 2015-12-14: qty 1

## 2015-12-14 MED ORDER — ONDANSETRON HCL 4 MG/2ML IJ SOLN: 4.0000 mg | Freq: Three times a day (TID) | INTRAMUSCULAR | Status: DC | PRN

## 2015-12-14 MED ORDER — NALOXONE HCL 2 MG/2ML IJ SOSY: 1.0000 ug/kg/h | PREFILLED_SYRINGE | INTRAVENOUS | Status: DC | PRN

## 2015-12-14 MED ORDER — HYDROMORPHONE HCL 1 MG/ML IJ SOLN: 0.2500 mg | INTRAMUSCULAR | Status: DC | PRN

## 2015-12-14 MED ORDER — IBUPROFEN 600 MG PO TABS: 600.0000 mg | ORAL_TABLET | Freq: Four times a day (QID) | ORAL | Status: DC | PRN

## 2015-12-14 MED ORDER — SIMETHICONE 80 MG PO CHEW: 80.0000 mg | CHEWABLE_TABLET | ORAL | Status: DC | PRN

## 2015-12-14 MED ORDER — SIMETHICONE 80 MG PO CHEW
80.0000 mg | CHEWABLE_TABLET | ORAL | Status: DC
  Administered 2015-12-14 – 2015-12-15 (×2): 80 mg via ORAL
  Filled 2015-12-14 (×2): qty 1

## 2015-12-14 MED ORDER — OXYTOCIN 10 UNIT/ML IJ SOLN: 2.5000 [IU]/h | INTRAMUSCULAR | Status: AC

## 2015-12-14 MED ORDER — ACETAMINOPHEN 500 MG PO TABS
1000.0000 mg | ORAL_TABLET | Freq: Four times a day (QID) | ORAL | Status: AC
  Administered 2015-12-14 – 2015-12-15 (×3): 1000 mg via ORAL
  Filled 2015-12-14 (×3): qty 2

## 2015-12-14 MED ORDER — DIPHENHYDRAMINE HCL 25 MG PO CAPS
25.0000 mg | ORAL_CAPSULE | ORAL | Status: DC | PRN
  Administered 2015-12-14 – 2015-12-15 (×2): 25 mg via ORAL
  Filled 2015-12-14 (×2): qty 1

## 2015-12-14 MED ORDER — CEFAZOLIN SODIUM-DEXTROSE 2-3 GM-% IV SOLR
2.0000 g | INTRAVENOUS | Status: AC
  Administered 2015-12-14: 2 g via INTRAVENOUS
  Filled 2015-12-14 (×2): qty 50

## 2015-12-14 MED ORDER — BUPIVACAINE IN DEXTROSE 0.75-8.25 % IT SOLN
INTRATHECAL | Status: DC | PRN
  Administered 2015-12-14: 1.4 mL via INTRATHECAL

## 2015-12-14 MED ORDER — SENNOSIDES-DOCUSATE SODIUM 8.6-50 MG PO TABS
2.0000 | ORAL_TABLET | ORAL | Status: DC
  Administered 2015-12-14 – 2015-12-15 (×2): 2 via ORAL
  Filled 2015-12-14 (×2): qty 2

## 2015-12-14 MED ORDER — SODIUM CHLORIDE 0.9% FLUSH: 3.0000 mL | INTRAVENOUS | Status: DC | PRN

## 2015-12-14 MED ORDER — LACTATED RINGERS IV SOLN
INTRAVENOUS | Status: DC
  Administered 2015-12-15: 02:00:00 via INTRAVENOUS

## 2015-12-14 MED ORDER — ONDANSETRON HCL 4 MG/2ML IJ SOLN
INTRAMUSCULAR | Status: AC
  Filled 2015-12-14: qty 2

## 2015-12-14 MED ORDER — FAMOTIDINE IN NACL 20-0.9 MG/50ML-% IV SOLN
20.0000 mg | Freq: Once | INTRAVENOUS | Status: AC
  Administered 2015-12-14: 20 mg via INTRAVENOUS
  Filled 2015-12-14: qty 50

## 2015-12-14 MED ORDER — LACTATED RINGERS IV BOLUS (SEPSIS)
1000.0000 mL | Freq: Once | INTRAVENOUS | Status: AC
  Administered 2015-12-14: 1000 mL via INTRAVENOUS

## 2015-12-14 MED ORDER — IBUPROFEN 600 MG PO TABS
600.0000 mg | ORAL_TABLET | Freq: Four times a day (QID) | ORAL | Status: DC
  Administered 2015-12-14 – 2015-12-16 (×5): 600 mg via ORAL
  Filled 2015-12-14 (×6): qty 1

## 2015-12-14 MED ORDER — FAMOTIDINE IN NACL 20-0.9 MG/50ML-% IV SOLN
INTRAVENOUS | Status: DC
Start: 2015-12-14 — End: 2015-12-14
  Filled 2015-12-14: qty 50

## 2015-12-14 MED ORDER — PRENATAL MULTIVITAMIN CH
1.0000 | ORAL_TABLET | Freq: Every day | ORAL | Status: DC
Start: 2015-12-14 — End: 2015-12-16
  Administered 2015-12-15: 1 via ORAL
  Filled 2015-12-14: qty 1

## 2015-12-14 MED ORDER — MENTHOL 3 MG MT LOZG: 1.0000 | LOZENGE | OROMUCOSAL | Status: DC | PRN

## 2015-12-14 MED ORDER — OXYTOCIN 10 UNIT/ML IJ SOLN
INTRAMUSCULAR | Status: AC
  Filled 2015-12-14: qty 4

## 2015-12-14 MED ORDER — DIPHENHYDRAMINE HCL 50 MG/ML IJ SOLN
12.5000 mg | INTRAMUSCULAR | Status: DC | PRN
  Administered 2015-12-14: 12.5 mg via INTRAVENOUS
  Filled 2015-12-14: qty 1

## 2015-12-14 MED ORDER — OXYTOCIN 10 UNIT/ML IJ SOLN
40.0000 [IU] | INTRAVENOUS | Status: DC | PRN
  Administered 2015-12-14: 40 [IU] via INTRAVENOUS

## 2015-12-14 MED ORDER — LACTATED RINGERS IV SOLN
INTRAVENOUS | Status: DC
  Administered 2015-12-14 (×2): via INTRAVENOUS

## 2015-12-14 MED ORDER — PHENYLEPHRINE 8 MG IN D5W 100 ML (0.08MG/ML) PREMIX OPTIME
INJECTION | INTRAVENOUS | Status: AC
  Filled 2015-12-14: qty 100

## 2015-12-14 MED ORDER — PHENYLEPHRINE HCL 10 MG/ML IJ SOLN
INTRAMUSCULAR | Status: DC | PRN
  Administered 2015-12-14 (×3): 40 ug via INTRAVENOUS

## 2015-12-14 MED ORDER — MORPHINE SULFATE (PF) 0.5 MG/ML IJ SOLN
INTRAMUSCULAR | Status: DC | PRN
  Administered 2015-12-14: .2 mg via INTRATHECAL

## 2015-12-14 MED ORDER — LACTATED RINGERS IV SOLN
INTRAVENOUS | Status: DC | PRN
  Administered 2015-12-14: 13:00:00 via INTRAVENOUS

## 2015-12-14 MED ORDER — KETOROLAC TROMETHAMINE 30 MG/ML IJ SOLN
INTRAMUSCULAR | Status: AC
  Administered 2015-12-14: 30 mg via INTRAMUSCULAR
  Filled 2015-12-14: qty 1

## 2015-12-14 MED ORDER — PHENYLEPHRINE 8 MG IN D5W 100 ML (0.08MG/ML) PREMIX OPTIME
INJECTION | INTRAVENOUS | Status: DC | PRN
  Administered 2015-12-14: 60 ug/min via INTRAVENOUS

## 2015-12-14 MED ORDER — LANOLIN HYDROUS EX OINT: 1.0000 "application " | TOPICAL_OINTMENT | CUTANEOUS | Status: DC | PRN

## 2015-12-14 MED ORDER — KETOROLAC TROMETHAMINE 30 MG/ML IJ SOLN: 30.0000 mg | Freq: Four times a day (QID) | INTRAMUSCULAR | Status: DC | PRN

## 2015-12-14 MED ORDER — DIPHENHYDRAMINE HCL 25 MG PO CAPS: 25.0000 mg | ORAL_CAPSULE | Freq: Four times a day (QID) | ORAL | Status: DC | PRN

## 2015-12-14 MED ORDER — FENTANYL CITRATE (PF) 100 MCG/2ML IJ SOLN
INTRAMUSCULAR | Status: DC | PRN
  Administered 2015-12-14: 10 ug via INTRATHECAL

## 2015-12-14 MED ORDER — SIMETHICONE 80 MG PO CHEW
80.0000 mg | CHEWABLE_TABLET | Freq: Three times a day (TID) | ORAL | Status: DC
  Filled 2015-12-14: qty 1

## 2015-12-14 MED ORDER — MORPHINE SULFATE (PF) 0.5 MG/ML IJ SOLN
INTRAMUSCULAR | Status: AC
Start: 2015-12-14 — End: 2015-12-14
  Filled 2015-12-14: qty 10

## 2015-12-14 MED ORDER — KETOROLAC TROMETHAMINE 30 MG/ML IJ SOLN
30.0000 mg | Freq: Once | INTRAMUSCULAR | Status: AC
  Administered 2015-12-14: 30 mg via INTRAVENOUS
  Filled 2015-12-14: qty 1

## 2015-12-14 MED ORDER — OXYCODONE-ACETAMINOPHEN 5-325 MG PO TABS: 2.0000 | ORAL_TABLET | ORAL | Status: DC | PRN

## 2015-12-14 MED ORDER — NALBUPHINE HCL 10 MG/ML IJ SOLN: 5.0000 mg | Freq: Once | INTRAMUSCULAR | Status: DC | PRN

## 2015-12-14 MED ORDER — NALBUPHINE HCL 10 MG/ML IJ SOLN: 5.0000 mg | INTRAMUSCULAR | Status: DC | PRN

## 2015-12-14 MED ORDER — KETOROLAC TROMETHAMINE 30 MG/ML IJ SOLN
30.0000 mg | Freq: Four times a day (QID) | INTRAMUSCULAR | Status: DC | PRN
  Administered 2015-12-14: 30 mg via INTRAMUSCULAR

## 2015-12-14 MED ORDER — CITRIC ACID-SODIUM CITRATE 334-500 MG/5ML PO SOLN
30.0000 mL | Freq: Once | ORAL | Status: AC
  Administered 2015-12-14: 30 mL via ORAL
  Filled 2015-12-14: qty 15

## 2015-12-14 MED ORDER — OXYCODONE-ACETAMINOPHEN 5-325 MG PO TABS: 1.0000 | ORAL_TABLET | ORAL | Status: DC | PRN

## 2015-12-14 MED ORDER — NALOXONE HCL 0.4 MG/ML IJ SOLN: 0.4000 mg | INTRAMUSCULAR | Status: DC | PRN

## 2015-12-14 MED ORDER — FENTANYL CITRATE (PF) 100 MCG/2ML IJ SOLN
INTRAMUSCULAR | Status: AC
  Filled 2015-12-14: qty 2

## 2015-12-14 MED ORDER — ACETAMINOPHEN 325 MG PO TABS: 650.0000 mg | ORAL_TABLET | ORAL | Status: DC | PRN

## 2015-12-14 MED ORDER — SCOPOLAMINE 1 MG/3DAYS TD PT72
1.0000 | MEDICATED_PATCH | Freq: Once | TRANSDERMAL | Status: DC
  Filled 2015-12-14: qty 1

## 2015-12-14 MED ORDER — ONDANSETRON HCL 4 MG/2ML IJ SOLN
INTRAMUSCULAR | Status: DC | PRN
  Administered 2015-12-14: 4 mg via INTRAVENOUS

## 2015-12-14 MED ORDER — WITCH HAZEL-GLYCERIN EX PADS: 1.0000 "application " | MEDICATED_PAD | CUTANEOUS | Status: DC | PRN

## 2015-12-14 MED ORDER — SCOPOLAMINE 1 MG/3DAYS TD PT72
MEDICATED_PATCH | TRANSDERMAL | Status: DC | PRN
  Administered 2015-12-14: 1 via TRANSDERMAL

## 2015-12-14 MED ORDER — ZOLPIDEM TARTRATE 5 MG PO TABS: 5.0000 mg | ORAL_TABLET | Freq: Every evening | ORAL | Status: DC | PRN

## 2015-12-14 MED ORDER — DIBUCAINE 1 % RE OINT: 1.0000 "application " | TOPICAL_OINTMENT | RECTAL | Status: DC | PRN

## 2015-12-14 SURGICAL SUPPLY — 37 items
BARRIER ADHS 3X4 INTERCEED (GAUZE/BANDAGES/DRESSINGS) IMPLANT
BENZOIN TINCTURE PRP APPL 2/3 (GAUZE/BANDAGES/DRESSINGS) ×3 IMPLANT
CLAMP CORD UMBIL (MISCELLANEOUS) IMPLANT
CLOSURE WOUND 1/2 X4 (GAUZE/BANDAGES/DRESSINGS) ×1
CLOTH BEACON ORANGE TIMEOUT ST (SAFETY) ×3 IMPLANT
DRSG OPSITE POSTOP 4X10 (GAUZE/BANDAGES/DRESSINGS) ×3 IMPLANT
DURAPREP 26ML APPLICATOR (WOUND CARE) ×3 IMPLANT
ELECT REM PT RETURN 9FT ADLT (ELECTROSURGICAL) ×3
ELECTRODE REM PT RTRN 9FT ADLT (ELECTROSURGICAL) ×1 IMPLANT
EXTRACTOR VACUUM KIWI (MISCELLANEOUS) IMPLANT
GLOVE BIO SURGEON STRL SZ 6.5 (GLOVE) ×2 IMPLANT
GLOVE BIO SURGEONS STRL SZ 6.5 (GLOVE) ×1
GLOVE BIOGEL PI IND STRL 7.0 (GLOVE) ×2 IMPLANT
GLOVE BIOGEL PI INDICATOR 7.0 (GLOVE) ×4
GOWN STRL REUS W/TWL LRG LVL3 (GOWN DISPOSABLE) ×6 IMPLANT
KIT ABG SYR 3ML LUER SLIP (SYRINGE) IMPLANT
NEEDLE HYPO 22GX1.5 SAFETY (NEEDLE) IMPLANT
NEEDLE HYPO 25X5/8 SAFETYGLIDE (NEEDLE) IMPLANT
NS IRRIG 1000ML POUR BTL (IV SOLUTION) ×3 IMPLANT
PACK C SECTION WH (CUSTOM PROCEDURE TRAY) ×3 IMPLANT
PAD ABD 7.5X8 STRL (GAUZE/BANDAGES/DRESSINGS) ×3 IMPLANT
PAD OB MATERNITY 4.3X12.25 (PERSONAL CARE ITEMS) ×3 IMPLANT
PENCIL SMOKE EVAC W/HOLSTER (ELECTROSURGICAL) ×3 IMPLANT
RETRACTOR WND ALEXIS 25 LRG (MISCELLANEOUS) IMPLANT
RTRCTR C-SECT PINK 25CM LRG (MISCELLANEOUS) ×3 IMPLANT
RTRCTR WOUND ALEXIS 25CM LRG (MISCELLANEOUS)
SPONGE GAUZE 4X4 12PLY STER LF (GAUZE/BANDAGES/DRESSINGS) ×6 IMPLANT
STRIP CLOSURE SKIN 1/2X4 (GAUZE/BANDAGES/DRESSINGS) ×2 IMPLANT
STRIP CLOSURE SKIN 1X5 (GAUZE/BANDAGES/DRESSINGS) ×2 IMPLANT
STRIP CLOSURE STERI-STRIP 1X5 (GAUZE/BANDAGES/DRESSINGS) ×1
SUT VIC AB 0 CT1 36 (SUTURE) ×18 IMPLANT
SUT VIC AB 2-0 CT1 27 (SUTURE) ×2
SUT VIC AB 2-0 CT1 TAPERPNT 27 (SUTURE) ×1 IMPLANT
SUT VIC AB 4-0 PS2 27 (SUTURE) ×3 IMPLANT
SYR CONTROL 10ML LL (SYRINGE) IMPLANT
TOWEL OR 17X24 6PK STRL BLUE (TOWEL DISPOSABLE) ×3 IMPLANT
TRAY FOLEY CATH SILVER 14FR (SET/KITS/TRAYS/PACK) IMPLANT

## 2015-12-14 NOTE — Anesthesia Preprocedure Evaluation (Signed)
Anesthesia Evaluation  Patient identified by MRN, date of birth, ID band Patient awake    Reviewed: Allergy & Precautions, H&P , NPO status , Patient's Chart, lab work & pertinent test results  Airway Mallampati: I  TM Distance: >3 FB Neck ROM: full    Dental no notable dental hx.    Pulmonary neg pulmonary ROS,    Pulmonary exam normal        Cardiovascular negative cardio ROS Normal cardiovascular exam     Neuro/Psych negative neurological ROS  negative psych ROS   GI/Hepatic negative GI ROS, Neg liver ROS,   Endo/Other  negative endocrine ROS  Renal/GU negative Renal ROS     Musculoskeletal   Abdominal (+) + obese,   Peds  Hematology negative hematology ROS (+)   Anesthesia Other Findings   Reproductive/Obstetrics (+) Pregnancy                             Anesthesia Physical Anesthesia Plan  ASA: II and emergent  Anesthesia Plan: Spinal   Post-op Pain Management:    Induction:   Airway Management Planned:   Additional Equipment:   Intra-op Plan:   Post-operative Plan:   Informed Consent: I have reviewed the patients History and Physical, chart, labs and discussed the procedure including the risks, benefits and alternatives for the proposed anesthesia with the patient or authorized representative who has indicated his/her understanding and acceptance.     Plan Discussed with: CRNA and Surgeon  Anesthesia Plan Comments:         Anesthesia Quick Evaluation

## 2015-12-14 NOTE — MAU Note (Signed)
Pains started at 0530, comes and goes.. Small amt of blood with each pain. Denies hx of low lying placenta or previa

## 2015-12-14 NOTE — Op Note (Signed)
Cesarean Section Operative Report  [name] [name]  12/14/2015  Indications: Breech Presentation and history cesarean section   Pre-operative Diagnosis: repeat c-section, breech presentation.   Post-operative Diagnosis: Same - low transverse incision  Surgeon: Surgeon(s) and Role:    * Adam Phenix, MD - Primary    * Kathrynn Running, MD - Assisting   Attending Attestation: I was present and scrubbed for the entire procedure.   Anesthesia: spinal    Estimated Blood Loss: 600 ml  Total IV Fluids: 2300 ml LR  Urine Output:: 100 ml concentrated yellow urine  Specimens: placenta to pathology  Findings: Viable female infant in breech presentation; Apgars not yet recorded; weight 2600 g; arterial cord pH 7.252; clear amniotic fluid; intact placenta with three vessel cord; normal uterus, fallopian tubes and ovaries bilaterally. Bladder adhered to lower uterine segment, no other significant adhesive disease.  Baby condition / location:  NICU   Complications: no complications  Indications: [name] [name] is a 25 y.o. [phone] with an IUP [redacted]w[redacted]d presenting in active labor, footling breech presentation. History low vertical c/s in 2014 for preterm labor with breech presentation.  The risks, benefits, complications, treatment options, and expected outcomes were discussed with the patient . The patient concurred with the proposed plan, giving informed consent. identified as [name] [name] and the procedure verified as C-Section Delivery.  Procedure Details:  The patient was taken back to the operative suite where spinal anesthesia was placed.  A time out was held and the above information confirmed.   After induction of anesthesia, the patient was draped and prepped in the usual sterile manner and placed in a dorsal supine position with a leftward tilt. A Pfannenstiel incision was made and carried down through the subcutaneous tissue to the fascia. Fascial incision was made and sharply  extended transversely. The fascia was separated from the underlying rectus tissue superiorly and inferiorly. The peritoneum was identified and sharply entered and extended longitudinally. Alexis retractor was placed. Bladder flap was created. A low transverse uterine incision was made and extended bluntly. Delivered from the frank breech presentation was a viable infant with Apgars and weight as above. The umbilical cord was clamped and cut cord blood was obtained for evaluation. Cord ph was sent. The placenta was removed Intact and appeared normal. The uterine outline, tubes and ovaries appeared normal. The uterine incision was closed with running locked sutures of 0Vicryl with an imbricating layer of the same.   Hemostasis was observed. The peritoneum was closed with 0 vicryl. The rectus muscles were examined and hemostasis observed. The fascia was then reapproximated with running sutures of 0Vicryl. No subcuticular closure. The skin was closed with 4-0Vicryl.   Instrument, sponge, and needle counts were correct prior the abdominal closure and were correct at the conclusion of the case.     Disposition: PACU - hemodynamically stable.   Maternal Condition: stable       Signed: Cherrie Gauze WoukMD 12/14/2015 1:07 PM

## 2015-12-14 NOTE — Anesthesia Procedure Notes (Addendum)
Spinal Patient location during procedure: OR Start time: 12/14/2015 12:11 PM End time: 12/14/2015 12:14 PM Staffing Anesthesiologist: Leilani Able Performed by: anesthesiologist  Preanesthetic Checklist Completed: patient identified, surgical consent, pre-op evaluation, timeout performed, IV checked, risks and benefits discussed and monitors and equipment checked Spinal Block Patient position: sitting Prep: site prepped and draped and DuraPrep Patient monitoring: heart rate, cardiac monitor, continuous pulse ox and blood pressure Approach: midline Location: L3-4 Injection technique: single-shot Needle Needle type: Sprotte  Needle gauge: 24 G Needle length: 9 cm Needle insertion depth: 5 cm Assessment Sensory level: T3

## 2015-12-14 NOTE — Anesthesia Postprocedure Evaluation (Signed)
Anesthesia Post Note  Patient: [name] [name]  Procedure(s) Performed: Procedure(s) (LRB): CESAREAN SECTION (N/A)  Patient location during evaluation: PACU Anesthesia Type: Spinal Level of consciousness: awake Pain management: pain level controlled Vital Signs Assessment: post-procedure vital signs reviewed and stable Respiratory status: spontaneous breathing Cardiovascular status: stable Postop Assessment: no headache, no backache, spinal receding, patient able to bend at knees and no signs of nausea or vomiting Anesthetic complications: no    Last Vitals:  Filed Vitals:   12/14/15 1345 12/14/15 1400  BP: 92/48 89/48  Pulse: 59 58  Temp: 36.4 C   Resp: 19 17    Last Pain:  Filed Vitals:   12/14/15 1411  PainSc: 0-No pain                 Nestor Wieneke JR,[name]

## 2015-12-14 NOTE — H&P (Signed)
LABOR AND DELIVERY ADMISSION HISTORY AND PHYSICAL NOTE  [name] [name] is a 25 y.o. female [phone] with IUP at [redacted]w[redacted]d by lmp presenting for contractions. Painful and frequent since this morning. No prenatal care this pregnancy .   She reports positive fetal movement. She denies leakage of fluid or vaginal bleeding.  Prenatal History/Complications:  Past Medical History: Past Medical History  Diagnosis Date  . Medical history non-contributory   . Breech presentation without mention of version, delivered 12/25/2013  . S/P emergency cesarean section 12/25/2013    Past Surgical History: Past Surgical History  Procedure Laterality Date  . No past surgeries    . Cesarean section N/A 12/25/2013    Procedure: Primary Cesarean Section Delivery Baby Girl @ 0542, Apgars;  Surgeon: Sherron Monday, MD;  Location: [facility];  Service: Obstetrics;  Laterality: N/A;    Obstetrical History: OB History    Gravida Para Term Preterm AB TAB SAB Ectopic Multiple Living   Social History: Social History   Social History  . Marital Status: Single    Spouse Name: N/A  . Number of Children: N/A  . Years of Education: N/A   Social History Main Topics  . Smoking status: Never Smoker   . Smokeless tobacco: Never Used  . Alcohol Use: No  . Drug Use: No  . Sexual Activity: Yes    Birth Control/ Protection: None   Other Topics Concern  . None   Social History Narrative    Family History: Family History  Problem Relation Age of Onset  . Cancer Neg Hx   . Diabetes Neg Hx   . Kidney disease Neg Hx     Allergies: No Known Allergies  Prescriptions prior to admission  Medication Sig Dispense Refill Last Dose  . ibuprofen (ADVIL,MOTRIN) 800 MG tablet Take 1 tablet (800 mg total) by mouth every 8 (eight) hours. (Patient not taking: Reported on 12/14/2015) 45 tablet 1   . oxyCODONE-acetaminophen (PERCOCET/ROXICET) 5-325 MG per tablet Take 1-2 tablets by mouth every 6 (six) hours as  needed for severe pain (moderate - severe pain). (Patient not taking: Reported on 12/14/2015) 40 tablet 0   . Prenatal Vit-Fe Fumarate-FA (PRENATAL MULTIVITAMIN) TABS tablet Take 1 tablet by mouth daily at 12 noon. 30 tablet 12 12/12/2015     Review of Systems   All systems reviewed and negative except as stated in HPI  Blood pressure 106/56, pulse 79, temperature 98.4 F (36.9 C), temperature source Oral, resp. rate 20, height 4' 11.5" (1.511 m), weight 165 lb 3.2 oz (74.934 kg), last menstrual period 04/08/2015, unknown if currently breastfeeding. General appearance: alert, cooperative, appears stated age and mild distress Lungs: clear to auscultation bilaterally Heart: regular rate and rhythm Abdomen: soft, non-tender; bowel sounds normal Extremities: No calf swelling or tenderness Presentation: footling breech per other provider's exam Fetal monitoring: 140/mod/+a/-d Uterine activity: q 3-4 min  Dilation: 8 Effacement (%): 100 Exam by:: m williams,cnm   Prenatal labs: - no prenatal care, labs drawn. O positive.  Prenatal Transfer Tool  No prenatal care. Breech.  No results found for this or any previous visit (from the past 24 hour(s)).  Patient Active Problem List   Diagnosis Date Noted  . Breech presentation without mention of version, delivered 12/25/2013  . S/P emergency cesarean section 12/25/2013  . Preterm premature rupture of membranes (PPROM) delivered, current hospitalization 12/16/2013    Assessment: [name] [name] is a  25 y.o. [id number] at [redacted]w[redacted]d here in active labor, footling breech. History low vertical c/s 2015 @ approximately 24 weeks for breech presentation  #Labor: admit for urgent c/s. The risks of cesarean section were discussed with the patient including but were not limited to: bleeding which may require transfusion or reoperation; infection which may require antibiotics; injury to bowel, bladder, ureters or other surrounding organs; injury to the fetus;  need for additional procedures including hysterectomy in the event of a life-threatening hemorrhage; placental abnormalities wth subsequent pregnancies, incisional problems, thromboembolic phenomenon and other postoperative/anesthesia complications.  The patient concurred with the proposed plan, giving informed written consent for the procedures.  Patient has been NPO since 6 AM. #Pain: spinal #FWB:  cat 1 #ID: gbs unknown, will get ancef shortly  Silvano Bilis 12/14/2015, 11:56 AM

## 2015-12-14 NOTE — Transfer of Care (Signed)
Immediate Anesthesia Transfer of Care Note  Patient: [name] [name]  Procedure(s) Performed: Procedure(s): CESAREAN SECTION (N/A)  Patient Location: PACU  Anesthesia Type:Spinal  Level of Consciousness: awake, alert  and oriented  Airway & Oxygen Therapy: Patient Spontanous Breathing  Post-op Assessment: Report given to RN and Post -op Vital signs reviewed and stable  Post vital signs: Reviewed and stable  Last Vitals:  Filed Vitals:   12/14/15 1058  BP: 106/56  Pulse: 79  Temp: 36.9 C  Resp: 20    Complications: No apparent anesthesia complications

## 2015-12-14 NOTE — MAU Note (Signed)
First appt at 'public health' was this afternoon, with pain and bleeding-felt she just needed to come in

## 2015-12-15 ENCOUNTER — Encounter (HOSPITAL_COMMUNITY): Payer: Self-pay | Admitting: Obstetrics & Gynecology

## 2015-12-15 LAB — GC/CHLAMYDIA PROBE AMP (~~LOC~~) NOT AT ARMC
Chlamydia: NEGATIVE
NEISSERIA GONORRHEA: NEGATIVE

## 2015-12-15 LAB — SICKLE CELL SCREEN: Sickle Cell Screen: NEGATIVE

## 2015-12-15 LAB — CBC
HCT: 29.5 % — ABNORMAL LOW (ref 36.0–46.0)
Hemoglobin: 10.2 g/dL — ABNORMAL LOW (ref 12.0–15.0)
MCH: 29.2 pg (ref 26.0–34.0)
MCHC: 34.6 g/dL (ref 30.0–36.0)
MCV: 84.5 fL (ref 78.0–100.0)
PLATELETS: 245 10*3/uL (ref 150–400)
RBC: 3.49 MIL/uL — ABNORMAL LOW (ref 3.87–5.11)
RDW: 12.6 % (ref 11.5–15.5)
WBC: 12 10*3/uL — AB (ref 4.0–10.5)

## 2015-12-15 LAB — HIV ANTIBODY (ROUTINE TESTING W REFLEX): HIV SCREEN 4TH GENERATION: NONREACTIVE

## 2015-12-15 LAB — RPR: RPR Ser Ql: NONREACTIVE

## 2015-12-15 LAB — RUBELLA SCREEN: Rubella: 1.82 index (ref >0.99)

## 2015-12-15 NOTE — Lactation Note (Signed)
This note was copied from a baby's chart. Lactation Consultation Note  Initial visit made.  Baby is 24 hours old and initially in the NICU.  Mom had no prenatal care and baby is possibly 35.5 weeks.  Last baby was born at 24 weeks and mom pumped for 6 months and had a good supply.  FOB is present and assists with interpreting. He states they have a symphony pump at home.  Baby has been formula feeding per bottle.  Mom has pump in room and states she has pumped.  She states baby doesn't like breast.  Offered assist and mom willing.  Assisted with positioning baby in cradle hold and mom easily expressed colostrum for baby to taste.  Baby latched and sucked for a brief time and then fell asleep.  Instructed to put baby to breast with feeding cues, post pump every 3 hours and supplement with 10-15 mls of expressed milk/formula.  Patient Name: [name] [name] [id number] Date: 12/15/2015 Reason for consult: Initial assessment;Infant < 6lbs;Late preterm infant   Maternal Data Formula Feeding for Exclusion: No Has patient been taught Hand Expression?: Yes Does the patient have breastfeeding experience prior to this delivery?: Yes  Feeding Feeding Type: Breast Fed  LATCH Score/Interventions Latch: Too sleepy or reluctant, no latch achieved, no sucking elicited.  Audible Swallowing: None  Type of Nipple: Everted at rest and after stimulation  Comfort (Breast/Nipple): Soft / non-tender     Hold (Positioning): Assistance needed to correctly position infant at breast and maintain latch.  LATCH Score: 5  Lactation Tools Discussed/Used Initiated by:: RN Date initiated:: 12/14/15   Consult Status Consult Status: Follow-up Date: 12/16/15 Follow-up type: In-patient    Huston Foley 12/15/2015, 12:42 PM

## 2015-12-15 NOTE — Addendum Note (Signed)
Addendum  created 12/15/15 0805 by Algis Greenhouse, CRNA   Modules edited: Clinical Notes   Clinical Notes:  File: [id number]

## 2015-12-15 NOTE — Anesthesia Postprocedure Evaluation (Signed)
Anesthesia Post Note  Patient: [name] [name]  Procedure(s) Performed: Procedure(s) (LRB): CESAREAN SECTION (N/A)  Patient location during evaluation: [facility] Anesthesia Type: Spinal Level of consciousness: awake Pain management: satisfactory to patient Vital Signs Assessment: post-procedure vital signs reviewed and stable Respiratory status: spontaneous breathing Cardiovascular status: stable Anesthetic complications: no Comments: Current pain score 0    Last Vitals:  Filed Vitals:   12/15/15 0428 12/15/15 0549  BP: 62/39 85/45  Pulse: 57 52  Temp: 36.9 C 36.6 C  Resp: 15 16    Last Pain:  Filed Vitals:   12/15/15 0709  PainSc: 0-No pain                 [name]

## 2015-12-15 NOTE — Progress Notes (Signed)
Subjective: Postpartum Day 1: Cesarean Delivery, Patient reports incisional pain, tolerating PO and no problems voiding.  Pt speaks english well.  Objective: Vital signs in last 24 hours: Temp:  [97.5 F (36.4 C)-98.7 F (37.1 C)] 97.9 F (36.6 C) (02/09 0549) Pulse Rate:  [47-79] 52 (02/09 0549) Resp:  [14-21] 16 (02/09 0549) BP: (62-128)/(39-94) 85/45 mmHg (02/09 0549) SpO2:  [95 %-100 %] 99 % (02/09 0700) Weight:  [74.934 kg (165 lb 3.2 oz)] 74.934 kg (165 lb 3.2 oz) (02/08 1058)  Physical Exam:  General: alert, cooperative and no distress Lochia: appropriate Uterine Fundus: firm Incision: healing well, dressing dry DVT Evaluation: No evidence of DVT seen on physical exam.   Recent Labs  12/14/15 1201 12/15/15 0600  HGB 11.8* 10.2*  HCT 34.2* 29.5*    Assessment/Plan: Status post Cesarean section. Doing well postoperatively.  Continue current care, baby is with her in room, maternal d/c will depend on baby's course, possible d/c in a.m.. Breast feeding BC"pt states she wants the "5 yr shot", spoke with husband, who clarifies as IUD. We will give IUD info brochure to pt. Nursing aware of the need . Bayan Hedstrom V 12/15/2015, 7:29 AM

## 2015-12-15 NOTE — Clinical Social Work Maternal (Signed)
CLINICAL SOCIAL WORK MATERNAL/CHILD NOTE  Patient Details  Name: [name] [name] MRN: [id number] Date of Birth: 10/05/1991  Date:  12/15/2015  Clinical Social Worker Initiating Note:  Julieta Rogalski BSW, MSW intern  Date/ Time Initiated:  12/15/15/1350     Child's Name:  [name] [name]    Legal Guardian:  [name] [name]    Need for Interpreter:  None   Date of Referral:  12/14/15     Reason for Referral:  Late or No Prenatal Care    Referral Source:  Central Nursery   Address:  [address] [place], [place] [address]  Phone number:  [phone]   Household Members:  Self, Minor Children, Spouse   Natural Supports (not living in the home):  Immediate Family, Spouse/significant other   Professional Supports: Home Care Staff- 2 year old daughter has a home nurse M-F from 7am-3pm.    Employment: Unemployed   Type of Work:     Education:      Financial Resources:  Medicaid   Other Resources:  WIC   Cultural/Religious Considerations Which May Impact Care:  None reported   Strengths:  Ability to meet basic needs , Home prepared for child    Risk Factors/Current Problems: MOB presents with no prenatal records. MOB reported she only had two health department visits, one in December when she found out she was pregnant and another was scheduled a day after she delivered.    Cognitive State:  Insightful , Linear Thinking , Able to Concentrate , Alert    Mood/Affect:  Interested , Calm , Comfortable , Relaxed , Happy    CSW Assessment:  MSW intern presented in patients room due to a consult being placed by the central nursery because MOB did not have prenatal care during the pregnancy. MOB was alone in the room and provided verbal consent for MSW intern to engage. MOB presented to be in a happy mood as evidence by her smiling and actively engaging in the assessment. Per MOB, she was recovering well from her C-section. MOB voiced she is both bottle and breast feeding the  infant and shared that it had been going well so far. However, MOB requested more bottles for the infant because she had ran out. MSW intern informed RN of MOB's request for formula. MOB disclosed she has three daughters ages 6, 4 and 2 years old. According to MOB, her two year old and the infant were both C-sections. MOB reported that one is in school and FOB had left to go get her and drop her off at home. MOB also shared that her 25 year old is with MOB's family right now and her two year old is being cared by her home nurse. MOB disclosed that her two year old daughter has a home nurse that cares for her Monday-Friday from 7am- 3pm. Per MOB, she does not have family in the area but does have support from FOB and his family. MOB voiced having met all of the infant's needs and that they will be using the two-year old's infant car seat since she is to big for it now. According to MOB, FOB is the primary breadwinner in the home and they live together with their daughters.   MSW intern asked MOB about her mental health during the pregnancy. MOB stated her mental health was fine and she had no concerns. MSW intern asked MOB if she experienced any perinatal mood disorders after her last three daughters. MOB asked MSW intern what that   was because she did not understand. MSW intern provided education on the various symptoms a mother can experience postpartum and asked MOB if any of those symtpoms sounded familiar, crying, lacking sleep, over worrying, irritability, sadness and suicidal thoughts. MOB denied experiencing any of those symptoms but did share she worries for her children. MSW intern acknowledged MOB's feelings if worry towards her children and asked MOB if those feelings ever interfered with her daily activities. MOB denied her feelings of worry interfering with her day and shared it was just "normal parental" feelings. MSW intern provided education on perinatal mood disorders. MOB expressed she understood  now what perinatal mood disorders were and shared that she had no concerns but would contact a doctor if needs arise.   MSW intern inquired about MOB's prenatal care during the pregnancy. MOB reported she had two health department visits. According to [facility], she was unaware of her pregnancy until December 2016 when she went to the health department to get a pregnancy test. MOB shared shortly after they requested her to come back with all her information in order to initiate care.  Per MOB, she had an ultrasound scheduled on February 9 but she started to have contractions and came to the hospital and has given birth since. MSW intern asked MOB if she consumed any alcohol or non- prescribed substances during the pregnancy. MOB reported that she does not drink alcohol and has not consumed anything during the pregnancy. MSW intern informed MOB about the hospital's drug screening policy and procedures. MOB was okay with the information provided and reassured MSW intern that she did not consume anything that would harm the infant during the pregnancy.  MOB asked MSW intern about how she could get more formula once discharged. MSW intern asked MOB if she had registered with [facility]. MOB shared that she receives pregnancy Medicaid and plans to add the infant and re-apply for [facility]. MSW intern asked MOB if she had any other questions or concerns, MOB denied having any other questions but agreed to contact MSW intern if needs arise.   CSW Plan/Description:   Engineer, mining- MSW intern provided education on perinatal mood disorders.  UDS is negative, MSW intern to monitor cord tissue drug screen and file a CPS report if needed.  No Further Intervention Required/No Barriers to Discharge    Trevor Iha, Student-SW 02-24-2016, 2:12 PM

## 2015-12-16 ENCOUNTER — Encounter (HOSPITAL_COMMUNITY): Payer: Self-pay | Admitting: *Deleted

## 2015-12-16 MED ORDER — IBUPROFEN 600 MG PO TABS: 600.0000 mg | ORAL_TABLET | Freq: Four times a day (QID) | ORAL | Status: DC | PRN

## 2015-12-16 MED ORDER — DOCUSATE SODIUM 100 MG PO CAPS: 100.0000 mg | ORAL_CAPSULE | Freq: Two times a day (BID) | ORAL | Status: DC | PRN

## 2015-12-16 MED ORDER — OXYCODONE-ACETAMINOPHEN 5-325 MG PO TABS: 1.0000 | ORAL_TABLET | Freq: Four times a day (QID) | ORAL | Status: DC | PRN

## 2015-12-16 NOTE — Discharge Summary (Signed)
OB Discharge Summary     Patient Name: [name] [name] DOB: 1991/06/18 MRN: [id number]  Date of admission: 12/14/2015 Delivering MD: Adam Phenix   Date of discharge: 12/16/2015  Admitting diagnosis: HAVING PAIN, NO PRENATAL CARE, PREVIOUS CESAREAN SECTION Intrauterine pregnancy: [redacted]w[redacted]d     Secondary diagnosis:  Active Problems:   Language barrier, cultural differences   Preterm labor in third trimester with preterm delivery   Malpresentation of fetus   Status post cesarean delivery   History of preterm delivery, currently pregnant   Breech presentation    Discharge diagnosis: Preterm Pregnancy Delivered                                                                                                Post partum procedures: None  Complications: None  Hospital course:  Onset of Labor With Unplanned C/S  25 y.o. yo [id number] at [redacted]w[redacted]d was admitted in Active Labor on 12/14/2015. Patient had a labor course significant for previous cesarean section in breech presentation. The patient went for cesarean section due to [facility], and delivered a Viable female infant on 12/14/2015. Details of operation can be found in separate operative note. Patient had an uncomplicated postpartum course.  She is ambulating,tolerating a regular diet, passing flatus, and urinating well.  Patient is discharged home in stable condition on 12/16/2015.  Physical exam  Filed Vitals:   12/15/15 1400 12/15/15 1754 12/15/15 2133 12/16/15 0554  BP: 97/45  Pulse:  54 51 57  Temp: 98.2 F (36.8 C) 98.8 F (37.1 C) 98.3 F (36.8 C) 98 F (36.7 C)  TempSrc: Oral Oral Oral Oral  Resp: Height:      Weight:      SpO2: 100% 100% 99% 99%   General: alert, cooperative and no distress Lochia: appropriate Uterine Fundus: firm Incision: Healing well with no significant drainage, No significant erythema, Dressing is clean, dry, and intact DVT Evaluation: No evidence of DVT seen on physical  exam. Negative Homan's sign. Labs: Lab Results  Component Value Date   WBC 12.0* 12/15/2015   HGB 10.2* 12/15/2015   HCT 29.5* 12/15/2015   MCV 84.5 12/15/2015   PLT 245 12/15/2015   CMP Latest Ref Rng 12/26/2013  Glucose 70 - 99 mg/dL 94  BUN 6 - 23 mg/dL 7  Creatinine 1.47 - 8.29 mg/dL 5.62(Z)  Sodium 308 - 657 mEq/L 137  Potassium 3.7 - 5.3 mEq/L 3.7  Chloride 96 - 112 mEq/L 104  CO2 19 - 32 mEq/L 23  Calcium 8.4 - 10.5 mg/dL 8.4    Discharge instruction: per After Visit Summary and "Baby and Me Booklet".  After visit meds:    Medication List    TAKE these medications        docusate sodium 100 MG capsule  Commonly known as:  COLACE  Take 1 capsule (100 mg total) by mouth 2 (two) times daily as needed.     ibuprofen 600 MG tablet  Commonly known as:  ADVIL,MOTRIN  Take 1 tablet (600 mg total) by mouth every 6 (six) hours  as needed for mild pain.     oxyCODONE-acetaminophen 5-325 MG tablet  Commonly known as:  PERCOCET/ROXICET  Take 1-2 tablets by mouth every 6 (six) hours as needed for severe pain (moderate - severe pain).     prenatal multivitamin Tabs tablet  Take 1 tablet by mouth daily at 12 noon.        Diet: routine diet  Activity: Advance as tolerated. Pelvic rest for 6 weeks.   Outpatient follow up:6 weeks Follow up Appt:No future appointments. Follow up Visit:No Follow-up on file.  Postpartum contraception: Undecided  Newborn Data: Live born female  Birth Weight: 5 lb 11.7 oz (2600 g) APGAR: ,   Baby Feeding: Bottle and Breast Disposition:home with mother   12/16/2015 Tereso Newcomer, MD

## 2015-12-16 NOTE — Lactation Note (Signed)
This note was copied from a baby's chart. Lactation Consultation Note  Patient Name: [name] [name] [id number] Date: 12/16/2015 Reason for consult: Follow-up assessment;Late preterm infant  Baby 25 hours old. Baby awake and cueing to nurse. [name] could not provide a [place] interpreter, but FOB in room and speaks English well. Assisted mom to latch baby to right breast in both cross-cradle and football position. Baby alert, but would not suckle. Could not elicit a suckle with this LC's gloved finger as well. Enc mom to continue to put baby to breast first with each feeding, and then supplement as discussed earlier. Mom has everted nipples with easily expressed colostrum, and baby would open her mouth to latch, but would not suckle. Enc mom to call for assistance as needed.   Maternal Data    Feeding Feeding Type: Breast Fed Length of feed: 0 min  LATCH Score/Interventions Latch: Too sleepy or reluctant, no latch achieved, no sucking elicited. Intervention(s): Skin to skin  Audible Swallowing: None Intervention(s): Skin to skin;Hand expression  Type of Nipple: Everted at rest and after stimulation  Comfort (Breast/Nipple): Soft / non-tender     Hold (Positioning): Assistance needed to correctly position infant at breast and maintain latch. Intervention(s): Breastfeeding basics reviewed;Support Pillows;Position options;Skin to skin  LATCH Score: 5  Lactation Tools Discussed/Used Tools: Pump Breast pump type: Double-Electric Breast Pump (Mom currently pumping and getting colostrum flowing.)   Consult Status Consult Status: Follow-up Date: 12/17/15 Follow-up type: In-patient    Geralynn Ochs 12/16/2015, 11:42 AM

## 2015-12-16 NOTE — Lactation Note (Addendum)
This note was copied from a baby's chart. Lactation Consultation Note  Patient Name: [name] [name] OJamisen Hawese: 12/16/2015 Reason for consult: Follow-up assessment;Late preterm infant  LPI 45 hours old. [employer] for "[place]" interpreter, but no one available at this time. Scheduled for an interpreter with "Lani" to call this LC if possible at 1130 in order to assist with latching and review LPI teaching.   Mom speaks some English and able to answer basic questions. Baby has lost 8% according to patient's nurse, Kenney Houseman, RN--mom was overfeeding baby and baby spit up afterwards. Mom counseled about appropriate feeding amounts and how to increase amounts slowly and she states that she understands. 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 FOB entered room and states that he speaks/understands [place]. Discussed LPI behavior and guidelines both parents. Mom states that she wants to nurse baby directly at breast, but baby not willing. This LC's phone number left on erase board and enc mom to call if baby cueing to nurse before 1130. Enc mom to put baby to breast first with each feeding, and then supplement with EBM/formula. Enc mom to feed baby with cues and at least every 3 hours, waking baby if necessary. Mom states that she has a DEBP at home and that she has no further questions at this time.   Discussed assessment and interventions with Kenney Houseman, RN.  Maternal Data    Feeding    LATCH Score/Interventions                      Lactation Tools Discussed/Used Tools: Pump Breast pump type: Double-Electric Breast Pump (Mom currently pumping and getting colostrum flowing.)   Consult Status Consult Status: Follow-up Date: 12/17/15 Follow-up type: In-patient    Geralynn Ochs 12/16/2015, 9:46 AM

## 2015-12-16 NOTE — Discharge Instructions (Signed)
Cesarean Delivery, Care After  Refer to this sheet in the next few weeks. These instructions provide you with information on caring for yourself after your procedure. Your health care provider may also give you specific instructions. Your treatment has been planned according to current medical practices, but problems sometimes occur. Call your health care provider if you have any problems or questions after you go home.  HOME CARE INSTRUCTIONS   Only take over-the-counter or prescription medications as directed by your health care provider.   Do not drink alcohol, especially if you are breastfeeding or taking medication to relieve pain.   Do not chew or smoke tobacco.   Continue to use good perineal care. Good perineal care includes:    Wiping your perineum from front to back.    Keeping your perineum clean.   Check your surgical cut (incision) daily for increased redness, drainage, swelling, or separation of skin.   Clean your incision gently with soap and water every day, and then pat it dry. If your health care provider says it is okay, leave the incision uncovered. Use a bandage (dressing) if the incision is draining fluid or appears irritated. If the adhesive strips across the incision do not fall off within 7 days, carefully peel them off.   Hug a pillow when coughing or sneezing until your incision is healed. This helps to relieve pain.   Do not use tampons or douche until your health care provider says it is okay.   Shower, wash your hair, and take tub baths as directed by your health care provider.   Wear a well-fitting bra that provides breast support.   Limit wearing support panties or control-top hose.   Drink enough fluids to keep your urine clear or pale yellow.   Eat high-fiber foods such as whole grain cereals and breads, brown rice, beans, and fresh fruits and vegetables every day. These foods may help prevent or relieve constipation.   Resume activities such as climbing stairs,  driving, lifting, exercising, or traveling as directed by your health care provider.   Talk to your health care provider about resuming sexual activities. This is dependent upon your risk of infection, your rate of healing, and your comfort and desire to resume sexual activity.   Try to have someone help you with your household activities and your newborn for at least a few days after you leave the hospital.   Rest as much as possible. Try to rest or take a nap when your newborn is sleeping.   Increase your activities gradually.   Keep all of your scheduled postpartum appointments. It is very important to keep your scheduled follow-up appointments. At these appointments, your health care provider will be checking to make sure that you are healing physically and emotionally.  SEEK MEDICAL CARE IF:    You are passing large clots from your vagina. Save any clots to show your health care provider.   You have a foul smelling discharge from your vagina.   You have trouble urinating.   You are urinating frequently.   You have pain when you urinate.   You have a change in your bowel movements.   You have increasing redness, pain, or swelling near your incision.   You have pus draining from your incision.   Your incision is separating.   You have painful, hard, or reddened breasts.   You have a severe headache.   You have blurred vision or see spots.   You feel sad   or depressed.   You have thoughts of hurting yourself or your newborn.   You have questions about your care, the care of your newborn, or medications.   You are dizzy or light-headed.   You have a rash.   You have pain, redness, or swelling at the site of the removed intravenous access (IV) tube.   You have nausea or vomiting.   You stopped breastfeeding and have not had a menstrual period within 12 weeks of stopping.   You are not breastfeeding and have not had a menstrual period within 12 weeks of delivery.   You have a fever.  SEEK  IMMEDIATE MEDICAL CARE IF:   You have persistent pain.   You have chest pain.   You have shortness of breath.   You faint.   You have leg pain.   You have stomach pain.   Your vaginal bleeding saturates 2 or more sanitary pads in 1 hour.  MAKE SURE YOU:    Understand these instructions.   Will watch your condition.   Will get help right away if you are not doing well or get worse.     This information is not intended to replace advice given to you by your health care provider. Make sure you discuss any questions you have with your health care provider.     Document Released: 07/14/2002 Document Revised: 11/12/2014 Document Reviewed: 06/18/2012  Elsevier Interactive Patient Education 2016 Elsevier Inc.

## 2015-12-16 NOTE — Progress Notes (Signed)
Pt. Is discharged in the care of husband, with N.T.  Escort. Denies Pain or discomfort. Abdominal dressing is clean and dry.[employer] good. No equipment needed for home use.. Infant to remain in  [place].Understands all discharge instructions.

## 2015-12-28 ENCOUNTER — Encounter (HOSPITAL_COMMUNITY): Payer: Self-pay | Admitting: *Deleted

## 2016-02-02 ENCOUNTER — Ambulatory Visit: Payer: Medicaid Other | Admitting: Certified Nurse Midwife

## 2016-02-16 ENCOUNTER — Ambulatory Visit: Payer: Medicaid Other | Admitting: Advanced Practice Midwife

## 2016-03-05 ENCOUNTER — Ambulatory Visit (INDEPENDENT_AMBULATORY_CARE_PROVIDER_SITE_OTHER): Payer: Medicaid Other | Admitting: Obstetrics and Gynecology

## 2016-03-05 ENCOUNTER — Encounter: Payer: Self-pay | Admitting: Obstetrics and Gynecology

## 2016-03-05 DIAGNOSIS — Z30017 Encounter for initial prescription of implantable subdermal contraceptive: Secondary | ICD-10-CM

## 2016-03-05 DIAGNOSIS — Z3202 Encounter for pregnancy test, result negative: Secondary | ICD-10-CM

## 2016-03-05 LAB — POCT URINALYSIS DIP (DEVICE)
Bilirubin Urine: NEGATIVE
GLUCOSE, UA: NEGATIVE mg/dL
Ketones, ur: NEGATIVE mg/dL
NITRITE: POSITIVE — AB
Protein, ur: NEGATIVE mg/dL
Specific Gravity, Urine: 1.02 (ref 1.005–1.030)
Urobilinogen, UA: 0.2 mg/dL (ref 0.0–1.0)
pH: 6 (ref 5.0–8.0)

## 2016-03-05 MED ORDER — ETONOGESTREL 68 MG ~~LOC~~ IMPL
68.0000 mg | DRUG_IMPLANT | Freq: Once | SUBCUTANEOUS | Status: AC
  Administered 2016-03-05: 68 mg via SUBCUTANEOUS

## 2016-03-05 NOTE — Addendum Note (Signed)
Addended by: Sherre LainASH, Lyly Canizales A on: 03/05/2016 03:57 PM   Modules accepted: Orders

## 2016-03-05 NOTE — Progress Notes (Signed)
Subjective:     [name] [name] is a 25 y.o. female who presents for a postpartum visit. She is 11 weeks postpartum following a low cervical transverse Cesarean section. I have fully reviewed the prenatal and intrapartum course. The delivery was at 35 gestational weeks. Outcome: repeat cesarean section, low transverse incision. Anesthesia: spinal. Postpartum course has been uncomplicated. Baby's course has been uncomplicated. Baby is feeding by breast. Bleeding no bleeding. Bowel function is normal. Bladder function is normal. Patient is sexually active. Contraception method is condoms. Postpartum depression screening: negative.     Review of Systems Pertinent items are noted in HPI.   Objective:    BP 120/70 mmHg  Pulse 67  Wt 164 lb (74.39 kg)  Breastfeeding? Yes  General:  alert, cooperative and no distress   Breasts:  inspection negative, no nipple discharge or bleeding, no masses or nodularity palpable  Lungs: clear to auscultation bilaterally  Heart:  regular rate and rhythm  Abdomen: soft, non-tender; bowel sounds normal; no masses,  no organomegaly  Incision: no erythema, induration or drainage   Vulva:  normal  Vagina: normal vagina, no discharge, exudate, lesion, or erythema  Cervix:  multiparous appearance  Corpus: normal size, contour, position, consistency, mobility, non-tender  Adnexa:  normal adnexa and no mass, fullness, tenderness  Rectal Exam: Not performed.        Assessment:     Normal postpartum exam. Pap smear not done at today's visit.   Plan:    1. Contraception: Nexplanon  Patient given informed consent, signed copy in the chart, time out was performed. Pregnancy test was negative.Patient understands that irregular bleeding may be present up to 1 year following Nexplanon placement Appropriate time out taken.  Patient's right arm was prepped and draped in the usual sterile fashion.. The ruler used to measure and mark insertion area.  Patient was prepped with  alcohol swab and then injected with 1.5 cc of 1% lidocaine with epinephrine.  Patient was prepped with betadine, Nexplanon removed form packaging.  Device confirmed in needle, then inserted full length of needle and withdrawn per handbook instructions.  Patient insertion site covered with a pressure.   Minimal blood loss.  Patient tolerated the procedure well.   2. Patient is medically cleared to resume all activities of daily living. 3. Follow up in: as needed.

## 2016-03-06 LAB — POCT PREGNANCY, URINE: Preg Test, Ur: NEGATIVE

## 2018-09-26 ENCOUNTER — Emergency Department (HOSPITAL_COMMUNITY)
Admission: EM | Admit: 2018-09-26 | Discharge: 2018-09-26 | Disposition: A | Discharge status: Home / Self Care | Payer: Medicaid Other | Attending: Emergency Medicine | Admitting: Emergency Medicine

## 2018-09-26 ENCOUNTER — Encounter (HOSPITAL_COMMUNITY): Payer: Self-pay | Admitting: *Deleted

## 2018-09-26 ENCOUNTER — Emergency Department (HOSPITAL_COMMUNITY): Payer: Medicaid Other

## 2018-09-26 ENCOUNTER — Other Ambulatory Visit: Payer: Self-pay

## 2018-09-26 DIAGNOSIS — N3001 Acute cystitis with hematuria: Secondary | ICD-10-CM | POA: Diagnosis not present

## 2018-09-26 DIAGNOSIS — R51 Headache: Secondary | ICD-10-CM | POA: Diagnosis present

## 2018-09-26 LAB — URINALYSIS, ROUTINE W REFLEX MICROSCOPIC
BILIRUBIN URINE: NEGATIVE
Glucose, UA: NEGATIVE mg/dL
KETONES UR: NEGATIVE mg/dL
NITRITE: POSITIVE — AB
PH: 6 (ref 5.0–8.0)
Protein, ur: NEGATIVE mg/dL
SPECIFIC GRAVITY, URINE: 1.018 (ref 1.005–1.030)

## 2018-09-26 LAB — CBC WITH DIFFERENTIAL/PLATELET
ABS IMMATURE GRANULOCYTES: 0.04 10*3/uL (ref 0.00–0.07)
BASOS PCT: 0 %
Basophils Absolute: 0.1 10*3/uL (ref 0.0–0.1)
EOS ABS: 0.6 10*3/uL — AB (ref 0.0–0.5)
EOS PCT: 5 %
HCT: 41.8 % (ref 36.0–46.0)
Hemoglobin: 13.6 g/dL (ref 12.0–15.0)
Immature Granulocytes: 0 %
Lymphocytes Relative: 25 %
Lymphs Abs: 2.9 10*3/uL (ref 0.7–4.0)
MCH: 30.1 pg (ref 26.0–34.0)
MCHC: 32.5 g/dL (ref 30.0–36.0)
MCV: 92.5 fL (ref 80.0–100.0)
MONO ABS: 0.4 10*3/uL (ref 0.1–1.0)
MONOS PCT: 4 %
Neutro Abs: 7.7 10*3/uL (ref 1.7–7.7)
Neutrophils Relative %: 66 %
PLATELETS: 282 10*3/uL (ref 150–400)
RBC: 4.52 MIL/uL (ref 3.87–5.11)
RDW: 11.9 % (ref 11.5–15.5)
WBC: 11.8 10*3/uL — AB (ref 4.0–10.5)
nRBC: 0 % (ref 0.0–0.2)

## 2018-09-26 LAB — COMPREHENSIVE METABOLIC PANEL
ALK PHOS: 97 U/L (ref 38–126)
ALT: 33 U/L (ref 0–44)
ANION GAP: 6 (ref 5–15)
AST: 37 U/L (ref 15–41)
Albumin: 3.3 g/dL — ABNORMAL LOW (ref 3.5–5.0)
BILIRUBIN TOTAL: 0.2 mg/dL — AB (ref 0.3–1.2)
BUN: 10 mg/dL (ref 6–20)
CALCIUM: 8.6 mg/dL — AB (ref 8.9–10.3)
CO2: 22 mmol/L (ref 22–32)
CREATININE: 0.63 mg/dL (ref 0.44–1.00)
Chloride: 108 mmol/L (ref 98–111)
GFR calc Af Amer: >60 mL/min (ref >60)
GFR calc non Af Amer: >60 mL/min (ref >60)
Glucose, Bld: 111 mg/dL — ABNORMAL HIGH (ref 70–99)
POTASSIUM: 3.5 mmol/L (ref 3.5–5.1)
SODIUM: 136 mmol/L (ref 135–145)
TOTAL PROTEIN: 7.1 g/dL (ref 6.5–8.1)

## 2018-09-26 LAB — PREGNANCY, URINE: Preg Test, Ur: NEGATIVE

## 2018-09-26 LAB — LIPASE, BLOOD: LIPASE: 28 U/L (ref 11–51)

## 2018-09-26 MED ORDER — CEFTRIAXONE SODIUM 1 G IJ SOLR
1.0000 g | Freq: Once | INTRAMUSCULAR | Status: AC
  Administered 2018-09-26: 1 g via INTRAVENOUS
  Filled 2018-09-26: qty 10

## 2018-09-26 MED ORDER — CEPHALEXIN 500 MG PO CAPS: 500.0000 mg | ORAL_CAPSULE | Freq: Four times a day (QID) | ORAL | 0 refills | Status: AC

## 2018-09-26 MED ORDER — KETOROLAC TROMETHAMINE 30 MG/ML IJ SOLN
30.0000 mg | Freq: Once | INTRAMUSCULAR | Status: AC
  Administered 2018-09-26: 30 mg via INTRAVENOUS
  Filled 2018-09-26: qty 1

## 2018-09-26 NOTE — Discharge Instructions (Addendum)
As we discussed today, your urine looked infected.  Please take antibiotics as directed.  Make sure you are drinking plenty of fluids and staying hydrated.  You can take Tylenol or Ibuprofen as directed for pain. You can alternate Tylenol and Ibuprofen every 4 hours. If you take Tylenol at 1pm, then you can take Ibuprofen at 5pm. Then you can take Tylenol again at 9pm.   Follow-up with the [facility] to establish a primary care doctor.  Return to emergency department for any fever, persistent vomiting/inability to eat or drink anything, worsening abdominal pain or any other worsening or concerning symptoms.

## 2018-09-26 NOTE — ED Provider Notes (Signed)
[facility] EMERGENCY DEPARTMENT Provider Note   CSN: [id number] Arrival date & time: 09/26/18  1528     History   Chief Complaint Chief Complaint  Patient presents with  . Headache  . Back Pain    HPI [name] [name] is a 27 y.o. female presents for evaluation of generalized headache, lower back pain that is been ongoing since yesterday.  She denies any preceding trauma, injury, fall.  Patient reports she has not taken any medication for the pain.  Patient states that the pain is mostly on her lower back.  She has been able to walk without any difficulty and denies any numbness/weakness of her extremities.  Patient states that she has also had some pain with urination and some lower abdominal cramping.  Patient reports nausea but no vomiting.  She has been able to eat and drink without any difficulty.  Patient also reports pain to where her Nexplanon birth control implant is on her arm.  Patient states that she is not currently on any blood thinners.  Patient denies any vision changes, chest pain, difficulty breathing, vaginal bleeding, numbness/weakness of her arms or legs.   The history is provided by the patient.    Past Medical History:  Diagnosis Date  . Breech presentation without mention of version, delivered 12/25/2013  . Medical history non-contributory   . S/P emergency cesarean section 12/25/2013    Patient Active Problem List   Diagnosis Date Noted  . Language barrier, cultural differences 12/14/2015  . Preterm labor in third trimester with preterm delivery 12/14/2015  . Malpresentation of fetus 12/14/2015  . Status post cesarean delivery 12/14/2015  . History of preterm delivery, currently pregnant 12/14/2015  . Breech presentation 12/14/2015    Past Surgical History:  Procedure Laterality Date  . CESAREAN SECTION N/A 12/25/2013   Procedure: Primary Cesarean Section Delivery Baby Girl @ 0542, Apgars;  Surgeon: Sherron Monday, MD;  Location: [facility];   Service: Obstetrics;  Laterality: N/A;  . CESAREAN SECTION N/A 12/14/2015   Procedure: CESAREAN SECTION;  Surgeon: Adam Phenix, MD;  Location: [facility];  Service: Obstetrics;  Laterality: N/A;  . NO PAST SURGERIES       OB History    Gravida  4   Para  4   Term  2   Preterm  2   AB      Living  4     SAB      TAB      Ectopic      Multiple  0   Live Births  4            Home Medications    Prior to Admission medications   Medication Sig Start Date End Date Taking? Authorizing Provider  cephALEXin (KEFLEX) 500 MG capsule Take 1 capsule (500 mg total) by mouth 4 (four) times daily for 7 days. 09/26/18 10/03/18  Maxwell Caul, PA-C  docusate sodium (COLACE) 100 MG capsule Take 1 capsule (100 mg total) by mouth 2 (two) times daily as needed. Patient not taking: Reported on 09/26/2018 12/16/15   Tereso Newcomer, MD  ibuprofen (ADVIL,MOTRIN) 600 MG tablet Take 1 tablet (600 mg total) by mouth every 6 (six) hours as needed for mild pain. Patient not taking: Reported on 09/26/2018 12/16/15   Tereso Newcomer, MD  oxyCODONE-acetaminophen (PERCOCET/ROXICET) 5-325 MG tablet Take 1-2 tablets by mouth every 6 (six) hours as needed for severe pain (moderate - severe pain). Patient not taking: Reported  on 09/26/2018 12/16/15   Tereso NewcomerAnyanwu, Ugonna A, MD  Prenatal Vit-Fe Fumarate-FA (PRENATAL MULTIVITAMIN) TABS tablet Take 1 tablet by mouth daily at 12 noon. Patient not taking: Reported on 09/26/2018 12/28/13   Sherian ReinBovard-Stuckert, Jody, MD    Family History Family History  Problem Relation Age of Onset  . Cancer Neg Hx   . Diabetes Neg Hx   . Kidney disease Neg Hx     Social History Social History   Tobacco Use  . Smoking status: Never Smoker  . Smokeless tobacco: Never Used  Substance Use Topics  . Alcohol use: No  . Drug use: No     Allergies   Patient has no known allergies.   Review of Systems Review of Systems  Respiratory: Negative for cough and shortness  of breath.   Cardiovascular: Negative for chest pain.  Gastrointestinal: Negative for abdominal pain, nausea and vomiting.  Genitourinary: Positive for dysuria. Negative for hematuria.  Musculoskeletal: Positive for back pain. Negative for neck pain.  Skin: Negative for color change.  Neurological: Negative for headaches.  All other systems reviewed and are negative.    Physical Exam Updated Vital Signs BP 115/80   Pulse 78   Temp 98.1 F (36.7 C) (Oral)   Resp 12   Ht 5' (1.524 m)   Wt 77.1 kg   SpO2 100%   BMI 33.20 kg/m   Physical Exam  Constitutional: She is oriented to person, place, and time. She appears well-developed and well-nourished.  HENT:  Head: Normocephalic and atraumatic.  Mouth/Throat: Oropharynx is clear and moist and mucous membranes are normal.  No tenderness to palpation of skull. No deformities or crepitus noted. No open wounds, abrasions or lacerations.   Eyes: Pupils are equal, round, and reactive to light. Conjunctivae, EOM and lids are normal.  Neck: Full passive range of motion without pain. Neck supple.  Full flexion/extension and lateral movement of neck fully intact. No bony midline tenderness. No deformities or crepitus.  Supple without any rigidity.  Cardiovascular: Normal rate, regular rhythm, normal heart sounds and normal pulses. Exam reveals no gallop and no friction rub.  No murmur heard. Pulmonary/Chest: Effort normal and breath sounds normal.  Lungs clear to auscultation bilaterally.  Symmetric chest rise.  No wheezing, rales, rhonchi.  Abdominal: Soft. Normal appearance. There is tenderness in the right lower quadrant, suprapubic area and left lower quadrant. There is no rigidity, no guarding and no CVA tenderness.  Abdomen is soft, non-distended.  Diffuse tenderness noted to the lower abdomen, most notably in the suprapubic region.  No focal point.  No tenderness noted at McBurney's point.  No CVA tenderness bilaterally.    Musculoskeletal: Normal range of motion.  Neurological: She is alert and oriented to person, place, and time.  Cranial nerves III-XII intact Follows commands, Moves all extremities  5/5 strength to BUE and BLE  Sensation intact throughout all major nerve distributions Normal coordination No gait abnormalities  No slurred speech. No facial droop.   Skin: Skin is warm and dry. Capillary refill takes less than 2 seconds.  Psychiatric: She has a normal mood and affect. Her speech is normal.  Nursing note and vitals reviewed.    ED Treatments / Results  Labs (all labs ordered are listed, but only abnormal results are displayed) Labs Reviewed  URINALYSIS, ROUTINE W REFLEX MICROSCOPIC - Abnormal; Notable for the following components:      Result Value   APPearance CLOUDY (*)    Hgb urine dipstick MODERATE (*)  Nitrite POSITIVE (*)    Leukocytes, UA MODERATE (*)    Bacteria, UA MANY (*)    All other components within normal limits  COMPREHENSIVE METABOLIC PANEL - Abnormal; Notable for the following components:   Glucose, Bld 111 (*)    Calcium 8.6 (*)    Albumin 3.3 (*)    Total Bilirubin 0.2 (*)    All other components within normal limits  CBC WITH DIFFERENTIAL/PLATELET - Abnormal; Notable for the following components:   WBC 11.8 (*)    Eosinophils Absolute 0.6 (*)    All other components within normal limits  LIPASE, BLOOD  PREGNANCY, URINE  POC URINE PREG, ED    EKG None  Radiology Ct Renal Stone Study  Result Date: 09/26/2018 CLINICAL DATA:  27 year old female with history of low back pain. Pain with urination. EXAM: CT ABDOMEN AND PELVIS WITHOUT CONTRAST TECHNIQUE: Multidetector CT imaging of the abdomen and pelvis was performed following the standard protocol without IV contrast. COMPARISON:  No priors. FINDINGS: Lower chest: Unremarkable. Hepatobiliary: No definite cystic or solid hepatic lesions are confidently identified on today's noncontrast CT examination.  Unenhanced appearance of the gallbladder is normal. Pancreas: No definite pancreatic mass or peripancreatic fluid or inflammatory changes are noted on today's noncontrast CT examination. Spleen: Unremarkable. Adrenals/Urinary Tract: There are no abnormal calcifications within the collecting system of either kidney, along the course of either ureter, or within the lumen of the urinary bladder. No hydroureteronephrosis or perinephric stranding to suggest urinary tract obstruction at this time. The unenhanced appearance of the kidneys is unremarkable bilaterally. Unenhanced appearance of the urinary bladder is normal. Bilateral adrenal glands are normal in appearance. Stomach/Bowel: Normal appearance of the stomach. No pathologic dilatation of small bowel or colon. Appendix is normal. Vascular/Lymphatic: No atherosclerotic calcifications in the abdominal aorta or pelvic vasculature. No lymphadenopathy noted in the abdomen or pelvis on today's noncontrast CT examination. Reproductive: Unenhanced appearance of the uterus and ovaries is unremarkable. Other: No significant volume of ascites.  No pneumoperitoneum. Musculoskeletal: There are no aggressive appearing lytic or blastic lesions noted in the visualized portions of the skeleton. IMPRESSION: 1. No acute findings are noted in the abdomen or pelvis to account for the patient's symptoms. Specifically, no urinary tract calculi no findings of urinary tract obstruction are noted at this time. Electronically Signed   By: Trudie Reed M.D.   On: 09/26/2018 19:06    Procedures Procedures (including critical care time)  Medications Ordered in ED Medications  cefTRIAXone (ROCEPHIN) 1 g in sodium chloride 0.9 % 100 mL IVPB (0 g Intravenous Stopped 09/26/18 1949)  ketorolac (TORADOL) 30 MG/ML injection 30 mg (30 mg Intravenous Given 09/26/18 1824)     Initial Impression / Assessment and Plan / ED Course  I have reviewed the triage vital signs and the nursing  notes.  Pertinent labs & imaging results that were available during my care of the patient were reviewed by me and considered in my medical decision making (see chart for details).     27 year old female who presents for evaluation of headache, lower back pain, lower abdominal cramping.  Also reports some dysuria and nausea.  No fevers, vomiting. Patient is afebrile, non-toxic appearing, sitting comfortably on examination table. Vital signs reviewed and stable.  On exam, patient with no CVA tenderness.  She has some diffuse tenderness in her lower abdomen which most in suprapubic region.  No focal point.  Consider UTI versus kidney stone versus pyelonephritis although patient does not have any  CVA tenderness on my exam.  Low suspicion for infectious etiology but will check labs.  Patient given analgesics.  Additionally, I suspect that headache is related to her overall generalized malaise.  History/physical exam is not concerning for CVA, acute to cranial hemorrhage, meningitis.  Urine pregnancy negative.  Lipase unremarkable.  CBC with slight leukocytosis at 11.8.  Otherwise unremarkable.  CMP is unremarkable.  Urine shows moderate hemoglobin, nitrates, moderate leukocytes with pyuria.  Rocephin given here in the ED.  CT renal study is negative for any acute normality.  No evidence of kidney stone.  Reevaluation.  Repeat abdomen exam is benign.  Patient reports feeling better after analgesics fluids and antibiotics.  Patient has been ambulating in the department any difficulty.  Patient able to tolerate p.o. without any difficulty.  I will plan to treat for UTI.  Patient with no known drug allergies.  Additionally, given her pyuria and report of lower back pain, will treat as pyelonephritis.  Patient given outpatient [facility] to establish primary care. Patient had ample opportunity for questions and discussion. All patient's questions were answered with full understanding. Strict return  precautions discussed. Patient expresses understanding and agreement to plan.   Final Clinical Impressions(s) / ED Diagnoses   Final diagnoses:  Acute cystitis with hematuria    ED Discharge Orders         Ordered    cephALEXin (KEFLEX) 500 MG capsule  4 times daily     09/26/18 1950           Rosana Hoes 09/26/18 2156    Gwyneth Sprout, MD 09/26/18 2240

## 2018-09-26 NOTE — ED Notes (Signed)
Patient verbalizes understanding of discharge instructions. Opportunity for questioning and answers were provided. Armband removed by staff, pt discharged from ED.  

## 2018-09-26 NOTE — ED Triage Notes (Signed)
Pt has multiple complaints. Reports recent headaches, cold symptoms, lower back pain, pain at bc implant site and pain with urination. No distress is noted at triage.

## 2020-11-21 ENCOUNTER — Other Ambulatory Visit: Payer: Self-pay

## 2020-11-21 ENCOUNTER — Encounter (HOSPITAL_COMMUNITY): Payer: Self-pay

## 2020-11-21 ENCOUNTER — Ambulatory Visit (HOSPITAL_COMMUNITY)
Admission: EM | Admit: 2020-11-21 | Discharge: 2020-11-21 | Disposition: A | Discharge status: Home / Self Care | Payer: Medicaid Other | Attending: Emergency Medicine | Admitting: Emergency Medicine

## 2020-11-21 ENCOUNTER — Ambulatory Visit (INDEPENDENT_AMBULATORY_CARE_PROVIDER_SITE_OTHER): Payer: Medicaid Other

## 2020-11-21 DIAGNOSIS — S62347A Nondisplaced fracture of base of fifth metacarpal bone. left hand, initial encounter for closed fracture: Secondary | ICD-10-CM

## 2020-11-21 MED ORDER — IBUPROFEN 600 MG PO TABS: 600.0000 mg | ORAL_TABLET | Freq: Four times a day (QID) | ORAL | 0 refills | Status: DC | PRN

## 2020-11-21 NOTE — ED Provider Notes (Signed)
HPI  SUBJECTIVE:  [name] [name] is a right-handed 30 y.o. female who presents with left little finger bruising, swelling, limitation of motion, pain after having a slip and fall down some stairs yesterday.  No numbness or tingling.  Denies other injury to the hand or wrist.  She tried ice with improvement in her symptoms.  She also tried icy hot.  Symptoms are worse with palpation, movement.  Past medical history negative for diabetes, hypertension, smoking.  LMP: Last week.  Denies the possibility of being pregnant.  PMD: None    Past Medical History:  Diagnosis Date  . Breech presentation without mention of version, delivered 12/25/2013  . Medical history non-contributory   . S/P emergency cesarean section 12/25/2013    Past Surgical History:  Procedure Laterality Date  . CESAREAN SECTION N/A 12/25/2013   Procedure: Primary Cesarean Section Delivery Baby Girl @ 0542, Apgars;  Surgeon: Sherron Monday, MD;  Location: [facility];  Service: Obstetrics;  Laterality: N/A;  . CESAREAN SECTION N/A 12/14/2015   Procedure: CESAREAN SECTION;  Surgeon: Adam Phenix, MD;  Location: [facility];  Service: Obstetrics;  Laterality: N/A;  . NO PAST SURGERIES      Family History  Problem Relation Age of Onset  . Cancer Neg Hx   . Diabetes Neg Hx   . Kidney disease Neg Hx     Social History   Tobacco Use  . Smoking status: Never Smoker  . Smokeless tobacco: Never Used  Substance Use Topics  . Alcohol use: No  . Drug use: No    No current facility-administered medications for this encounter.  Current Outpatient Medications:  .  docusate sodium (COLACE) 100 MG capsule, Take 1 capsule (100 mg total) by mouth 2 (two) times daily as needed. (Patient not taking: Reported on 09/26/2018), Disp: 30 capsule, Rfl: 2 .  ibuprofen (ADVIL,MOTRIN) 600 MG tablet, Take 1 tablet (600 mg total) by mouth every 6 (six) hours as needed for mild pain. (Patient not taking: Reported on 09/26/2018), Disp: 60 tablet, Rfl: 2 .   oxyCODONE-acetaminophen (PERCOCET/ROXICET) 5-325 MG tablet, Take 1-2 tablets by mouth every 6 (six) hours as needed for severe pain (moderate - severe pain). (Patient not taking: Reported on 09/26/2018), Disp: 40 tablet, Rfl: 0 .  Prenatal Vit-Fe Fumarate-FA (PRENATAL MULTIVITAMIN) TABS tablet, Take 1 tablet by mouth daily at 12 noon. (Patient not taking: Reported on 09/26/2018), Disp: 30 tablet, Rfl: 12  No Known Allergies   ROS  As noted in HPI.   Physical Exam  BP 92/62   Pulse 93   Resp 20   SpO2 97%   Breastfeeding No   Constitutional: Well developed, well nourished, no acute distress Eyes:  EOMI, conjunctiva normal bilaterally HENT: Normocephalic, atraumatic,mucus membranes moist Respiratory: Normal inspiratory effort Cardiovascular: Normal rate GI: nondistended skin: No rash, skin intact Musculoskeletal: Swelling over the dorsum and fingers of the left hand.  Positive bruising at the fifth metacarpal joint, proximal phalanx.  Tenderness at the fifth metacarpal, proximal phalanx little finger.  FDP/FDS intact.   limited movement due to pain.  Rest of the hand exam normal. Baseline Strength and Sensation to L hand with normal light touch intact for Pt, CR< 2 secs and pulse intact.   2-point discrimination intact at 72mm in affected digit. Skin intact.Wrist WNL.   Neurologic: Alert & oriented x 3, no focal neuro deficits Psychiatric: Speech and behavior appropriate   ED Course   Medications - No data to display  Orders Placed This  Encounter  Procedures  . DG Hand Complete Left    Standing Status:   Standing    Number of Occurrences:   1    Order Specific Question:   Reason for Exam (SYMPTOM  OR DIAGNOSIS REQUIRED)    Answer:   Fall, hyperextension little finger.  Positive bruising, swelling, tenderness fifth metacarpal, MCP joint, proximal phalanx. r/o dislocation fracture,  . Apply splint Ulnar Gutter    Standing Status:   Standing    Number of Occurrences:   1     Order Specific Question:   Laterality    Answer:   Left    No results found for this or any previous visit (from the past 24 hour(s)). DG Hand Complete Left  Result Date: 11/21/2020 CLINICAL DATA:  Recent fall with hyperextension injury and fifth digit pain, initial encounter EXAM: LEFT HAND - COMPLETE 3+ VIEW COMPARISON:  None. FINDINGS: Comminuted fracture is noted the base of the fifth proximal phalanx. Mild impaction and angulation is noted at the fracture site as well. No other fractures are seen. Generalized soft tissue swelling is noted. IMPRESSION: Fifth proximal phalangeal fracture with comminution and associated soft tissue swelling. Electronically Signed   By: Alcide Clever M.D.   On: 11/21/2020 18:45    ED Clinical Impression  1. Closed nondisplaced fracture of base of fifth metacarpal bone of left hand, initial encounter      ED Assessment/Plan   Reviewed imaging independently.  [facility] a fracture of the base of the fifth proximal phalanx.  Mild impaction and angulation noted.  Generalized soft tissue swelling.  See radiology report for full details.  Patient has limitation of motion of the little finger, but otherwise she is neurovascularly intact.  Will place an ulnar gutter splint.  She will need to follow-up with hand surgeon at [facility] within 3 to 5 days. home with Tylenol, ibuprofen combination.  Discussed imaging, MDM, treatment plan, and plan for follow-up with patient and husband. Discussed sn/sx that should prompt return to the ED. patient agrees with plan.   No orders of the defined types were placed in this encounter.   *This clinic note was created using Dragon dictation software. Therefore, there may be occasional mistakes despite careful proofreading.   ?    Domenick Gong, MD 11/22/20 [phone]

## 2020-11-21 NOTE — ED Triage Notes (Signed)
Pt in with c/o hand/finger injury that occurred last night when she fell  Pt applied ice to area with some relief  Denies numbness or tingling  Edema and bruising noted to right pinky/hand

## 2020-11-21 NOTE — ED Notes (Signed)
Ortho tech paged  

## 2020-11-21 NOTE — Discharge Instructions (Addendum)
Wear the splint at all times until you are seen by hand surgeon.  You must follow-up with a hand surgeon in 3 to 5 days.  Follow-up with any of the hand specialists at [facility].

## 2020-11-21 NOTE — ED Notes (Signed)
Ortho tech paged again

## 2020-11-21 NOTE — Progress Notes (Signed)
Orthopedic Tech Progress Note Patient Details:  [name] [name] 07/23/1991 [id number]  Ortho Devices Type of Ortho Device: Arm sling,Ulna gutter splint Ortho Device/Splint Location: lue Ortho Device/Splint Interventions: Ordered,Application,Adjustment   Post Interventions Patient Tolerated: Well Instructions Provided: Care of device,Adjustment of device   Trinna Post 11/21/2020, 8:06 PM

## 2021-09-16 ENCOUNTER — Encounter (HOSPITAL_COMMUNITY): Payer: Self-pay | Admitting: Emergency Medicine

## 2021-09-16 ENCOUNTER — Emergency Department (HOSPITAL_COMMUNITY)
Admission: EM | Admit: 2021-09-16 | Discharge: 2021-09-16 | Disposition: A | Discharge status: Home / Self Care | Payer: Medicaid Other | Attending: Emergency Medicine | Admitting: Emergency Medicine

## 2021-09-16 ENCOUNTER — Other Ambulatory Visit: Payer: Self-pay

## 2021-09-16 DIAGNOSIS — R059 Cough, unspecified: Secondary | ICD-10-CM | POA: Diagnosis present

## 2021-09-16 DIAGNOSIS — J101 Influenza due to other identified influenza virus with other respiratory manifestations: Secondary | ICD-10-CM | POA: Insufficient documentation

## 2021-09-16 DIAGNOSIS — Z5321 Procedure and treatment not carried out due to patient leaving prior to being seen by health care provider: Secondary | ICD-10-CM | POA: Insufficient documentation

## 2021-09-16 DIAGNOSIS — Z20822 Contact with and (suspected) exposure to covid-19: Secondary | ICD-10-CM | POA: Insufficient documentation

## 2021-09-16 LAB — RESP PANEL BY RT-PCR (FLU A&B, COVID) ARPGX2
Influenza A by PCR: POSITIVE — AB
Influenza B by PCR: NEGATIVE
SARS Coronavirus 2 by RT PCR: NEGATIVE

## 2021-09-16 NOTE — ED Notes (Signed)
Called pt x3 for room, no response. 

## 2021-09-16 NOTE — ED Notes (Signed)
Called x 3 NO answer 

## 2021-09-16 NOTE — ED Triage Notes (Signed)
Pt reports non-productive cough, fever, chills, and body aches x 1 week.  States symptoms have resolved other than cough.

## 2021-09-19 ENCOUNTER — Ambulatory Visit (INDEPENDENT_AMBULATORY_CARE_PROVIDER_SITE_OTHER): Payer: Medicaid Other

## 2021-09-19 ENCOUNTER — Other Ambulatory Visit: Payer: Self-pay

## 2021-09-19 ENCOUNTER — Encounter (HOSPITAL_COMMUNITY): Payer: Self-pay | Admitting: Emergency Medicine

## 2021-09-19 ENCOUNTER — Ambulatory Visit (HOSPITAL_COMMUNITY)
Admission: EM | Admit: 2021-09-19 | Discharge: 2021-09-19 | Disposition: A | Discharge status: Home / Self Care | Payer: Medicaid Other | Attending: Student | Admitting: Student

## 2021-09-19 DIAGNOSIS — Z789 Other specified health status: Secondary | ICD-10-CM | POA: Diagnosis not present

## 2021-09-19 DIAGNOSIS — R059 Cough, unspecified: Secondary | ICD-10-CM | POA: Diagnosis not present

## 2021-09-19 DIAGNOSIS — J189 Pneumonia, unspecified organism: Secondary | ICD-10-CM | POA: Diagnosis not present

## 2021-09-19 DIAGNOSIS — R0602 Shortness of breath: Secondary | ICD-10-CM | POA: Diagnosis not present

## 2021-09-19 MED ORDER — AMOXICILLIN-POT CLAVULANATE 875-125 MG PO TABS: 1.0000 | ORAL_TABLET | Freq: Two times a day (BID) | ORAL | 0 refills | Status: DC

## 2021-09-19 MED ORDER — ALBUTEROL SULFATE HFA 108 (90 BASE) MCG/ACT IN AERS: 1.0000 | INHALATION_SPRAY | Freq: Four times a day (QID) | RESPIRATORY_TRACT | 0 refills | Status: DC | PRN

## 2021-09-19 NOTE — ED Provider Notes (Signed)
[facility]    CSN: [id number] Arrival date & time: 09/19/21  4696      History   Chief Complaint Chief Complaint  Patient presents with   Cough    Chest congestion     HPI [name] [name] is a 30 y.o. female presenting with viral syndrome for 2 weeks.  Medical history noncontributory.  Describes 2 weeks of progressively worsening cough and shortness of breath following a viral syndrome.  States the cough is productive of green sputum, and she does have some shortness of breath with coughing and exertion.  Denies history of pulmonary disease or issues like this in the past.  Denies sick contacts.  Denies fever/chills, chest pain, dizziness.  Has not tried interventions for her symptoms.  HPI  Past Medical History:  Diagnosis Date   Breech presentation without mention of version, delivered 12/25/2013   Medical history non-contributory    S/P emergency cesarean section 12/25/2013    Patient Active Problem List   Diagnosis Date Noted   Language barrier, cultural differences 12/14/2015   Preterm labor in third trimester with preterm delivery 12/14/2015   Malpresentation of fetus 12/14/2015   Status post cesarean delivery 12/14/2015   History of preterm delivery, currently pregnant 12/14/2015   Breech presentation 12/14/2015    Past Surgical History:  Procedure Laterality Date   CESAREAN SECTION N/A 12/25/2013   Procedure: Primary Cesarean Section Delivery Baby Girl @ 0542, Apgars;  Surgeon: Sherron Monday, MD;  Location: [facility];  Service: Obstetrics;  Laterality: N/A;   CESAREAN SECTION N/A 12/14/2015   Procedure: CESAREAN SECTION;  Surgeon: Adam Phenix, MD;  Location: [facility];  Service: Obstetrics;  Laterality: N/A;   NO PAST SURGERIES      OB History     Gravida  4   Para  4   Term  2   Preterm  2   AB      Living  4      SAB      IAB      Ectopic      Multiple  0   Live Births  4            Home Medications    Prior to Admission  medications   Medication Sig Start Date End Date Taking? Authorizing Provider  albuterol (VENTOLIN HFA) 108 (90 Base) MCG/ACT inhaler Inhale 1-2 puffs into the lungs every 6 (six) hours as needed for wheezing or shortness of breath. 09/19/21  Yes Rhys Martini, PA-C  amoxicillin-clavulanate (AUGMENTIN) 875-125 MG tablet Take 1 tablet by mouth every 12 (twelve) hours. 09/19/21  Yes Rhys Martini, PA-C  ibuprofen (ADVIL) 600 MG tablet Take 1 tablet (600 mg total) by mouth every 6 (six) hours as needed. 11/21/20   Domenick Gong, MD    Family History Family History  Problem Relation Age of Onset   Cancer Neg Hx    Diabetes Neg Hx    Kidney disease Neg Hx     Social History Social History   Tobacco Use   Smoking status: Never   Smokeless tobacco: Never  Substance Use Topics   Alcohol use: No   Drug use: No     Allergies   Patient has no known allergies.   Review of Systems Review of Systems  HENT:  Positive for congestion.   Respiratory:  Positive for cough and shortness of breath.   All other systems reviewed and are negative.   Physical Exam Triage Vital Signs  ED Triage Vitals  Enc Vitals Group     BP 09/19/21 0958 115/78     Pulse Rate 09/19/21 0958 80     Resp 09/19/21 0958 18     Temp 09/19/21 0958 98.2 F (36.8 C)     Temp src --      SpO2 09/19/21 0958 96 %     Weight --      Height --      Head Circumference --      Peak Flow --      Pain Score 09/19/21 0956 0     Pain Loc --      Pain Edu? --      Excl. in GC? --    No data found.  Updated Vital Signs BP 115/78   Pulse 80   Temp 98.2 F (36.8 C)   Resp 18   SpO2 96%   Visual Acuity Right Eye Distance:   Left Eye Distance:   Bilateral Distance:    Right Eye Near:   Left Eye Near:    Bilateral Near:     Physical Exam Vitals reviewed.  Constitutional:      General: She is not in acute distress.    Appearance: Normal appearance. She is not ill-appearing.  HENT:     Head:  Normocephalic and atraumatic.     Right Ear: Tympanic membrane, ear canal and external ear normal. No tenderness. No middle ear effusion. There is no impacted cerumen. Tympanic membrane is not perforated, erythematous, retracted or bulging.     Left Ear: Tympanic membrane, ear canal and external ear normal. No tenderness.  No middle ear effusion. There is no impacted cerumen. Tympanic membrane is not perforated, erythematous, retracted or bulging.     Nose: Nose normal. No congestion.     Mouth/Throat:     Mouth: Mucous membranes are moist.     Pharynx: Uvula midline. No oropharyngeal exudate or posterior oropharyngeal erythema.  Eyes:     Extraocular Movements: Extraocular movements intact.     Pupils: Pupils are equal, round, and reactive to light.  Cardiovascular:     Rate and Rhythm: Normal rate and regular rhythm.     Heart sounds: Normal heart sounds.  Pulmonary:     Effort: Pulmonary effort is normal.     Breath sounds: Decreased breath sounds and rhonchi present. No wheezing or rales.     Comments: Decreased breath sounds throughout Rhonchi lower lung fields Abdominal:     Palpations: Abdomen is soft.     Tenderness: There is no abdominal tenderness. There is no guarding or rebound.  Lymphadenopathy:     Cervical: No cervical adenopathy.     Right cervical: No superficial cervical adenopathy.    Left cervical: No superficial cervical adenopathy.  Neurological:     General: No focal deficit present.     Mental Status: She is alert and oriented to person, place, and time.  Psychiatric:        Mood and Affect: Mood normal.        Behavior: Behavior normal.        Thought Content: Thought content normal.        Judgment: Judgment normal.     UC Treatments / Results  Labs (all labs ordered are listed, but only abnormal results are displayed) Labs Reviewed - No data to display  EKG   Radiology DG Chest 2 View  Result Date: 09/19/2021 CLINICAL DATA:  Cough and  shortness of breath EXAM:  CHEST - 2 VIEW COMPARISON:  None. FINDINGS: The heart size and mediastinal contours are within normal limits. Subtle heterogeneous airspace opacity of the left lung base seen on lateral view. The visualized skeletal structures are unremarkable. IMPRESSION: Subtle heterogeneous airspace opacity of the left lung base seen on lateral view, concerning for infection or aspiration. Electronically Signed   By: Jearld Lesch M.D.   On: 09/19/2021 10:46    Procedures Procedures (including critical care time)  Medications Ordered in UC Medications - No data to display  Initial Impression / Assessment and Plan / UC Course  I have reviewed the triage vital signs and the nursing notes.  Pertinent labs & imaging results that were available during my care of the patient were reviewed by me and considered in my medical decision making (see chart for details).     This patient is a very pleasant 30 y.o. year old female presenting with L lower lung pneumonia. Today this pt is afebrile nontachycardic nontachypneic, oxygenating well on room air, no wheezes rhonchi or rales. No history pulm ds. States she is not pregnant or breastfeeding. Implant contraception.  CXR - Subtle heterogeneous airspace opacity of the left lung base seen on lateral view, concerning for infection or aspiration.  Augmentin, albuterol inhaler.   ED return precautions discussed. Patient verbalizes understanding and agreement.   Coding Level 4 for acute illness with systemic symptoms (pneumonia), and prescription drug management  Final Clinical Impressions(s) / UC Diagnoses   Final diagnoses:  Pneumonia of left lower lobe due to infectious organism  Uses contraceptive implant for birth control     Discharge Instructions      -Start the antibiotic-Augmentin (amoxicillin-clavulanate), 1 pill every 12 hours for 7 days.  You can take this with food like with breakfast and dinner. -Albuterol inhaler as  needed for cough, wheezing, shortness of breath, 1 to 2 puffs every 6 hours as needed. -Follow-up with gynecologist for birth control implant removal     ED Prescriptions     Medication Sig Dispense Auth. Provider   amoxicillin-clavulanate (AUGMENTIN) 875-125 MG tablet Take 1 tablet by mouth every 12 (twelve) hours. 14 tablet Rhys Martini, PA-C   albuterol (VENTOLIN HFA) 108 (90 Base) MCG/ACT inhaler Inhale 1-2 puffs into the lungs every 6 (six) hours as needed for wheezing or shortness of breath. 1 each Rhys Martini, PA-C      PDMP not reviewed this encounter.   Rhys Martini, PA-C 09/19/21 1220

## 2021-09-19 NOTE — ED Triage Notes (Signed)
Pt is present today with cough and SOB. Pt sx started x2 weeks ago.

## 2021-09-19 NOTE — Discharge Instructions (Addendum)
-  Start the antibiotic-Augmentin (amoxicillin-clavulanate), 1 pill every 12 hours for 7 days.  You can take this with food like with breakfast and dinner. -Albuterol inhaler as needed for cough, wheezing, shortness of breath, 1 to 2 puffs every 6 hours as needed. -Follow-up with gynecologist for birth control implant removal

## 2021-12-30 ENCOUNTER — Encounter (HOSPITAL_COMMUNITY): Payer: Self-pay

## 2021-12-30 ENCOUNTER — Ambulatory Visit (HOSPITAL_COMMUNITY)
Admission: EM | Admit: 2021-12-30 | Discharge: 2021-12-30 | Disposition: A | Discharge status: Home / Self Care | Payer: Medicaid Other | Attending: Physician Assistant | Admitting: Physician Assistant

## 2021-12-30 ENCOUNTER — Other Ambulatory Visit: Payer: Self-pay

## 2021-12-30 DIAGNOSIS — R0602 Shortness of breath: Secondary | ICD-10-CM | POA: Diagnosis not present

## 2021-12-30 DIAGNOSIS — J069 Acute upper respiratory infection, unspecified: Secondary | ICD-10-CM | POA: Diagnosis not present

## 2021-12-30 DIAGNOSIS — Z20822 Contact with and (suspected) exposure to covid-19: Secondary | ICD-10-CM | POA: Diagnosis not present

## 2021-12-30 DIAGNOSIS — R051 Acute cough: Secondary | ICD-10-CM | POA: Diagnosis present

## 2021-12-30 LAB — SARS CORONAVIRUS 2 (TAT 6-24 HRS): SARS Coronavirus 2: NEGATIVE

## 2021-12-30 LAB — POC INFLUENZA A AND B ANTIGEN (URGENT CARE ONLY)
INFLUENZA A ANTIGEN, POC: NEGATIVE
INFLUENZA B ANTIGEN, POC: NEGATIVE

## 2021-12-30 MED ORDER — PROMETHAZINE-DM 6.25-15 MG/5ML PO SYRP: 5.0000 mL | ORAL_SOLUTION | Freq: Two times a day (BID) | ORAL | 0 refills | Status: DC | PRN

## 2021-12-30 MED ORDER — ALBUTEROL SULFATE HFA 108 (90 BASE) MCG/ACT IN AERS
2.0000 | INHALATION_SPRAY | Freq: Once | RESPIRATORY_TRACT | Status: AC
  Administered 2021-12-30: 2 via RESPIRATORY_TRACT

## 2021-12-30 MED ORDER — ALBUTEROL SULFATE HFA 108 (90 BASE) MCG/ACT IN AERS
INHALATION_SPRAY | RESPIRATORY_TRACT | Status: AC
  Filled 2021-12-30: qty 6.7

## 2021-12-30 NOTE — ED Triage Notes (Signed)
Pt presents with cough and congestion x 2 days. She is not taking anything for the cough.

## 2021-12-30 NOTE — Discharge Instructions (Addendum)
You are negative for flu.  We will contact you if you are positive for COVID.  Use Promethazine DM for cough.  This can make you sleepy so do not drive or drink alcohol taking it.  Use albuterol every 4-6 hours as needed for shortness of breath and cough.  Use Mucinex, Tylenol, Flonase for additional symptom relief.  If your symptoms or not improving by next week please return here or see your PCP.  If you have any worsening symptoms including shortness of breath, high fever, nausea/vomiting interfering with oral intake, chest pain, weakness you need to go to the emergency room.  Contact OB/GYN to arrange removal and replacement of your Nexplanon.

## 2021-12-30 NOTE — ED Provider Notes (Signed)
[facility]    CSN: [id number] Arrival date & time: 12/30/21  1140      History   Chief Complaint No chief complaint on file.   HPI [name] [name] is a 31 y.o. female.   Patient presents today with a 2-day history of URI symptoms.  Reports cough, congestion, shortness of breath.  Denies any fever, nausea, vomiting, chest pain.  She denies any known sick contacts but is around many individuals with similar symptoms.  She denies diagnosis of asthma or COPD but has had pneumonia in the past and was treated most recently in November 2022.  She denies additional antibiotic use since that time.  She has had COVID-19 vaccine but has not had COVID.  She has not had flu shot.  She does not smoke.  She denies any significant past medical history.  She is confident that she is not pregnant; has Nexplanon.  She is requesting referral to OB/GYN to have this replaced she is not currently established with specialist.   Past Medical History:  Diagnosis Date   Breech presentation without mention of version, delivered 12/25/2013   Medical history non-contributory    S/P emergency cesarean section 12/25/2013    Patient Active Problem List   Diagnosis Date Noted   Language barrier, cultural differences 12/14/2015   Preterm labor in third trimester with preterm delivery 12/14/2015   Malpresentation of fetus 12/14/2015   Status post cesarean delivery 12/14/2015   History of preterm delivery, currently pregnant 12/14/2015   Breech presentation 12/14/2015    Past Surgical History:  Procedure Laterality Date   CESAREAN SECTION N/A 12/25/2013   Procedure: Primary Cesarean Section Delivery Baby Girl @ 0542, Apgars;  Surgeon: Sherron Monday, MD;  Location: [facility];  Service: Obstetrics;  Laterality: N/A;   CESAREAN SECTION N/A 12/14/2015   Procedure: CESAREAN SECTION;  Surgeon: Adam Phenix, MD;  Location: [facility];  Service: Obstetrics;  Laterality: N/A;   NO PAST SURGERIES      OB History      Gravida  4   Para  4   Term  2   Preterm  2   AB      Living  4      SAB      IAB      Ectopic      Multiple  0   Live Births  4            Home Medications    Prior to Admission medications   Medication Sig Start Date End Date Taking? Authorizing Provider  promethazine-dextromethorphan (PROMETHAZINE-DM) 6.25-15 MG/5ML syrup Take 5 mLs by mouth 2 (two) times daily as needed for cough. 12/30/21  Yes Tashonna Descoteaux K, PA-C  albuterol (VENTOLIN HFA) 108 (90 Base) MCG/ACT inhaler Inhale 1-2 puffs into the lungs every 6 (six) hours as needed for wheezing or shortness of breath. 09/19/21   Rhys Martini, PA-C  amoxicillin-clavulanate (AUGMENTIN) 875-125 MG tablet Take 1 tablet by mouth every 12 (twelve) hours. 09/19/21   Rhys Martini, PA-C  ibuprofen (ADVIL) 600 MG tablet Take 1 tablet (600 mg total) by mouth every 6 (six) hours as needed. 11/21/20   Domenick Gong, MD    Family History Family History  Problem Relation Age of Onset   Cancer Neg Hx    Diabetes Neg Hx    Kidney disease Neg Hx     Social History Social History   Tobacco Use   Smoking status: Never   Smokeless  tobacco: Never  Substance Use Topics   Alcohol use: No   Drug use: No     Allergies   Patient has no known allergies.   Review of Systems Review of Systems  Constitutional:  Positive for activity change. Negative for appetite change, fatigue and fever.  HENT:  Positive for congestion. Negative for sinus pressure, sneezing and sore throat.   Respiratory:  Positive for cough and shortness of breath. Negative for chest tightness and wheezing.   Cardiovascular:  Negative for chest pain.  Gastrointestinal:  Negative for abdominal pain, diarrhea, nausea and vomiting.  Musculoskeletal:  Negative for arthralgias and myalgias.  Neurological:  Negative for dizziness, light-headedness and headaches.    Physical Exam Triage Vital Signs ED Triage Vitals [12/30/21 1330]  Enc Vitals  Group     BP 132/80     Pulse Rate 77     Resp 16     Temp 98.1 F (36.7 C)     Temp Source Oral     SpO2 100 %     Weight      Height      Head Circumference      Peak Flow      Pain Score      Pain Loc      Pain Edu?      Excl. in GC?    No data found.  Updated Vital Signs BP 132/80 (BP Location: Left Arm)    Pulse 77    Temp 98.1 F (36.7 C) (Oral)    Resp 16    LMP 11/23/2021 (Approximate)    SpO2 100%   Visual Acuity Right Eye Distance:   Left Eye Distance:   Bilateral Distance:    Right Eye Near:   Left Eye Near:    Bilateral Near:     Physical Exam Vitals reviewed.  Constitutional:      General: She is awake. She is not in acute distress.    Appearance: Normal appearance. She is well-developed. She is not ill-appearing.     Comments: Very pleasant female appears stated age in no acute distress sitting comfortably in exam room  HENT:     Head: Normocephalic and atraumatic.     Right Ear: Tympanic membrane, ear canal and external ear normal. Tympanic membrane is not erythematous or bulging.     Left Ear: Tympanic membrane, ear canal and external ear normal. Tympanic membrane is not erythematous or bulging.     Nose:     Right Sinus: No maxillary sinus tenderness or frontal sinus tenderness.     Left Sinus: No maxillary sinus tenderness or frontal sinus tenderness.     Mouth/Throat:     Pharynx: Uvula midline. Posterior oropharyngeal erythema present. No oropharyngeal exudate.  Cardiovascular:     Rate and Rhythm: Normal rate and regular rhythm.     Heart sounds: Normal heart sounds, S1 normal and S2 normal. No murmur heard. Pulmonary:     Effort: Pulmonary effort is normal.     Breath sounds: Wheezing present. No rhonchi or rales.     Comments: Scattered wheezing Psychiatric:        Behavior: Behavior is cooperative.     UC Treatments / Results  Labs (all labs ordered are listed, but only abnormal results are displayed) Labs Reviewed  SARS  CORONAVIRUS 2 (TAT 6-24 HRS)  POC INFLUENZA A AND B ANTIGEN (URGENT CARE ONLY)    EKG   Radiology No results found.  Procedures Procedures (including critical care  time)  Medications Ordered in UC Medications  albuterol (VENTOLIN HFA) 108 (90 Base) MCG/ACT inhaler 2 puff (2 puffs Inhalation Given 12/30/21 1431)    Initial Impression / Assessment and Plan / UC Course  I have reviewed the triage vital signs and the nursing notes.  Pertinent labs & imaging results that were available during my care of the patient were reviewed by me and considered in my medical decision making (see chart for details).     Patient is well-appearing, nontoxic, afebrile, nontachycardic.  No evidence of acute infection on physical exam of warrant initiation of antibiotics.  Discussed likely viral etiology.  Flu testing was negative.  COVID test is pending.  She was provided work excuse note with current CDC return to work guidelines based on COVID test result.  She did have some bronchospasm which resolved with in office albuterol was encouraged to use this every 4-6 hours as needed.  We will start Promethazine DM for cough and she was instructed not to drive or drink alcohol taking this medication as drowsiness is a common side effect.  She can use Mucinex, Tylenol, Flonase for symptom relief.  She is to rest and drink plenty of fluid.  Discussed alarm symptoms that warrant emergent evaluation including high fever, chest pain, shortness of breath, nausea/vomiting interfering oral intake.  Strict return precautions given to which she expressed understanding.  Final Clinical Impressions(s) / UC Diagnoses   Final diagnoses:  Upper respiratory tract infection, unspecified type  Acute cough     Discharge Instructions      You are negative for flu.  We will contact you if you are positive for COVID.  Use Promethazine DM for cough.  This can make you sleepy so do not drive or drink alcohol taking it.  Use  albuterol every 4-6 hours as needed for shortness of breath and cough.  Use Mucinex, Tylenol, Flonase for additional symptom relief.  If your symptoms or not improving by next week please return here or see your PCP.  If you have any worsening symptoms including shortness of breath, high fever, nausea/vomiting interfering with oral intake, chest pain, weakness you need to go to the emergency room.  Contact OB/GYN to arrange removal and replacement of your Nexplanon.     ED Prescriptions     Medication Sig Dispense Auth. Provider   promethazine-dextromethorphan (PROMETHAZINE-DM) 6.25-15 MG/5ML syrup Take 5 mLs by mouth 2 (two) times daily as needed for cough. 118 mL Izabella Marcantel K, PA-C      PDMP not reviewed this encounter.   Jeani Hawking, PA-C 12/30/21 1442

## 2022-01-26 ENCOUNTER — Encounter (HOSPITAL_COMMUNITY): Payer: Self-pay

## 2022-01-26 ENCOUNTER — Emergency Department (HOSPITAL_COMMUNITY): Payer: Medicaid Other

## 2022-01-26 ENCOUNTER — Other Ambulatory Visit: Payer: Self-pay

## 2022-01-26 ENCOUNTER — Inpatient Hospital Stay (HOSPITAL_COMMUNITY)
Admission: EM | Admit: 2022-01-26 | Discharge: 2022-01-28 | DRG: 831 | Disposition: A | Discharge status: Home / Self Care | Payer: Medicaid Other | Attending: Internal Medicine | Admitting: Internal Medicine

## 2022-01-26 ENCOUNTER — Inpatient Hospital Stay (HOSPITAL_COMMUNITY): Payer: Medicaid Other

## 2022-01-26 DIAGNOSIS — E876 Hypokalemia: Secondary | ICD-10-CM | POA: Diagnosis present

## 2022-01-26 DIAGNOSIS — D72829 Elevated white blood cell count, unspecified: Secondary | ICD-10-CM | POA: Diagnosis present

## 2022-01-26 DIAGNOSIS — O99281 Endocrine, nutritional and metabolic diseases complicating pregnancy, first trimester: Secondary | ICD-10-CM | POA: Diagnosis present

## 2022-01-26 DIAGNOSIS — Z3201 Encounter for pregnancy test, result positive: Secondary | ICD-10-CM | POA: Diagnosis present

## 2022-01-26 DIAGNOSIS — O99611 Diseases of the digestive system complicating pregnancy, first trimester: Secondary | ICD-10-CM | POA: Diagnosis present

## 2022-01-26 DIAGNOSIS — B9789 Other viral agents as the cause of diseases classified elsewhere: Secondary | ICD-10-CM | POA: Diagnosis present

## 2022-01-26 DIAGNOSIS — Z3A14 14 weeks gestation of pregnancy: Secondary | ICD-10-CM | POA: Diagnosis not present

## 2022-01-26 DIAGNOSIS — R0602 Shortness of breath: Secondary | ICD-10-CM

## 2022-01-26 DIAGNOSIS — Z79899 Other long term (current) drug therapy: Secondary | ICD-10-CM | POA: Diagnosis not present

## 2022-01-26 DIAGNOSIS — O99211 Obesity complicating pregnancy, first trimester: Secondary | ICD-10-CM | POA: Diagnosis present

## 2022-01-26 DIAGNOSIS — I301 Infective pericarditis: Secondary | ICD-10-CM | POA: Diagnosis present

## 2022-01-26 DIAGNOSIS — K229 Disease of esophagus, unspecified: Secondary | ICD-10-CM | POA: Diagnosis present

## 2022-01-26 DIAGNOSIS — Z20822 Contact with and (suspected) exposure to covid-19: Secondary | ICD-10-CM | POA: Diagnosis present

## 2022-01-26 DIAGNOSIS — K929 Disease of digestive system, unspecified: Secondary | ICD-10-CM | POA: Diagnosis present

## 2022-01-26 DIAGNOSIS — O0990 Supervision of high risk pregnancy, unspecified, unspecified trimester: Secondary | ICD-10-CM

## 2022-01-26 DIAGNOSIS — R778 Other specified abnormalities of plasma proteins: Secondary | ICD-10-CM

## 2022-01-26 DIAGNOSIS — O99111 Other diseases of the blood and blood-forming organs and certain disorders involving the immune mechanism complicating pregnancy, first trimester: Secondary | ICD-10-CM | POA: Diagnosis present

## 2022-01-26 DIAGNOSIS — R7989 Other specified abnormal findings of blood chemistry: Secondary | ICD-10-CM | POA: Diagnosis not present

## 2022-01-26 DIAGNOSIS — Z3A12 12 weeks gestation of pregnancy: Secondary | ICD-10-CM | POA: Diagnosis not present

## 2022-01-26 DIAGNOSIS — R079 Chest pain, unspecified: Principal | ICD-10-CM

## 2022-01-26 DIAGNOSIS — E871 Hypo-osmolality and hyponatremia: Secondary | ICD-10-CM | POA: Diagnosis present

## 2022-01-26 DIAGNOSIS — I5031 Acute diastolic (congestive) heart failure: Secondary | ICD-10-CM | POA: Diagnosis present

## 2022-01-26 DIAGNOSIS — O99411 Diseases of the circulatory system complicating pregnancy, first trimester: Secondary | ICD-10-CM | POA: Diagnosis present

## 2022-01-26 DIAGNOSIS — I509 Heart failure, unspecified: Secondary | ICD-10-CM

## 2022-01-26 DIAGNOSIS — R55 Syncope and collapse: Secondary | ICD-10-CM

## 2022-01-26 DIAGNOSIS — Z3490 Encounter for supervision of normal pregnancy, unspecified, unspecified trimester: Secondary | ICD-10-CM

## 2022-01-26 LAB — COMPREHENSIVE METABOLIC PANEL
ALT: 23 U/L (ref 0–44)
AST: 25 U/L (ref 15–41)
Albumin: 3.2 g/dL — ABNORMAL LOW (ref 3.5–5.0)
Alkaline Phosphatase: 75 U/L (ref 38–126)
Anion gap: 10 (ref 5–15)
BUN: 8 mg/dL (ref 6–20)
CO2: 20 mmol/L — ABNORMAL LOW (ref 22–32)
Calcium: 8.6 mg/dL — ABNORMAL LOW (ref 8.9–10.3)
Chloride: 103 mmol/L (ref 98–111)
Creatinine, Ser: 0.63 mg/dL (ref 0.44–1.00)
GFR, Estimated: >60 mL/min (ref >60)
Glucose, Bld: 122 mg/dL — ABNORMAL HIGH (ref 70–99)
Potassium: 3.2 mmol/L — ABNORMAL LOW (ref 3.5–5.1)
Sodium: 133 mmol/L — ABNORMAL LOW (ref 135–145)
Total Bilirubin: 0.4 mg/dL (ref 0.3–1.2)
Total Protein: 6.6 g/dL (ref 6.5–8.1)

## 2022-01-26 LAB — RESPIRATORY PANEL BY PCR

## 2022-01-26 LAB — HEMOGLOBIN A1C
Hgb A1c MFr Bld: 5 % (ref 4.8–5.6)
Mean Plasma Glucose: 96.8 mg/dL

## 2022-01-26 LAB — ECHOCARDIOGRAM COMPLETE
AR max vel: 1.36 cm2
AV Area VTI: 1.46 cm2
AV Area mean vel: 1.37 cm2
AV Mean grad: 4 mmHg
AV Peak grad: 6.7 mmHg
Ao pk vel: 1.29 m/s
Area-P 1/2: 2.5 cm2
Calc EF: 71.4 %
Height: 60 in
S' Lateral: 3.2 cm
Single Plane A2C EF: 70.2 %
Single Plane A4C EF: 71.2 %
Weight: 2720 oz

## 2022-01-26 LAB — CBC WITH DIFFERENTIAL/PLATELET
Abs Immature Granulocytes: 0.09 10*3/uL — ABNORMAL HIGH (ref 0.00–0.07)
Basophils Absolute: 0 10*3/uL (ref 0.0–0.1)
Basophils Relative: 0 %
Eosinophils Absolute: 0.2 10*3/uL (ref 0.0–0.5)
Eosinophils Relative: 2 %
HCT: 39.4 % (ref 36.0–46.0)
Hemoglobin: 13.5 g/dL (ref 12.0–15.0)
Immature Granulocytes: 1 %
Lymphocytes Relative: 15 %
Lymphs Abs: 2 10*3/uL (ref 0.7–4.0)
MCH: 31.3 pg (ref 26.0–34.0)
MCHC: 34.3 g/dL (ref 30.0–36.0)
MCV: 91.4 fL (ref 80.0–100.0)
Monocytes Absolute: 0.7 10*3/uL (ref 0.1–1.0)
Monocytes Relative: 5 %
Neutro Abs: 10.4 10*3/uL — ABNORMAL HIGH (ref 1.7–7.7)
Neutrophils Relative %: 77 %
Platelets: 244 10*3/uL (ref 150–400)
RBC: 4.31 MIL/uL (ref 3.87–5.11)
RDW: 12.4 % (ref 11.5–15.5)
WBC: 13.5 10*3/uL — ABNORMAL HIGH (ref 4.0–10.5)
nRBC: 0 % (ref 0.0–0.2)

## 2022-01-26 LAB — RESP PANEL BY RT-PCR (FLU A&B, COVID) ARPGX2
Influenza A by PCR: NEGATIVE
Influenza B by PCR: NEGATIVE
SARS Coronavirus 2 by RT PCR: NEGATIVE

## 2022-01-26 LAB — RAPID URINE DRUG SCREEN, HOSP PERFORMED
Amphetamines: NOT DETECTED
Barbiturates: NOT DETECTED
Benzodiazepines: NOT DETECTED
Cocaine: NOT DETECTED
Opiates: NOT DETECTED
Tetrahydrocannabinol: NOT DETECTED

## 2022-01-26 LAB — SEDIMENTATION RATE
Sed Rate: 30 mm/hr — ABNORMAL HIGH (ref 0–22)
Sed Rate: 38 mm/hr — ABNORMAL HIGH (ref 0–22)

## 2022-01-26 LAB — HCG, QUANTITATIVE, PREGNANCY: hCG, Beta Chain, Quant, S: 111841 m[IU]/mL — ABNORMAL HIGH (ref <5)

## 2022-01-26 LAB — TROPONIN I (HIGH SENSITIVITY)
Troponin I (High Sensitivity): 75 ng/L — ABNORMAL HIGH (ref <18)
Troponin I (High Sensitivity): 391 ng/L (ref <18)
Troponin I (High Sensitivity): 286 ng/L (ref <18)
Troponin I (High Sensitivity): 210 ng/L (ref <18)

## 2022-01-26 LAB — LIPID PANEL
Cholesterol: 173 mg/dL (ref 0–200)
HDL: 74 mg/dL (ref >40)
LDL Cholesterol: 91 mg/dL (ref 0–99)
Total CHOL/HDL Ratio: 2.3 RATIO
Triglycerides: 42 mg/dL (ref <150)
VLDL: 8 mg/dL (ref 0–40)

## 2022-01-26 LAB — HIV ANTIBODY (ROUTINE TESTING W REFLEX): HIV Screen 4th Generation wRfx: NONREACTIVE

## 2022-01-26 LAB — TYPE AND SCREEN
ABO/RH(D): O POS
Antibody Screen: NEGATIVE

## 2022-01-26 LAB — D-DIMER, QUANTITATIVE: D-Dimer, Quant: 1.13 ug/mL-FEU — ABNORMAL HIGH (ref 0.00–0.50)

## 2022-01-26 LAB — C-REACTIVE PROTEIN: CRP: 9.5 mg/dL — ABNORMAL HIGH (ref <1.0)

## 2022-01-26 LAB — BRAIN NATRIURETIC PEPTIDE: B Natriuretic Peptide: 24.8 pg/mL (ref 0.0–100.0)

## 2022-01-26 LAB — I-STAT BETA HCG BLOOD, ED (MC, WL, AP ONLY): I-stat hCG, quantitative: >2000 m[IU]/mL — ABNORMAL HIGH (ref <5)

## 2022-01-26 LAB — TSH: TSH: 0.336 u[IU]/mL — ABNORMAL LOW (ref 0.350–4.500)

## 2022-01-26 MED ORDER — IOHEXOL 350 MG/ML SOLN
50.0000 mL | Freq: Once | INTRAVENOUS | Status: AC | PRN
  Administered 2022-01-26: 50 mL via INTRAVENOUS

## 2022-01-26 MED ORDER — SODIUM CHLORIDE 0.9 % IV SOLN: 250.0000 mL | INTRAVENOUS | Status: DC | PRN

## 2022-01-26 MED ORDER — ONDANSETRON HCL 4 MG/2ML IJ SOLN: 4.0000 mg | Freq: Four times a day (QID) | INTRAMUSCULAR | Status: DC | PRN

## 2022-01-26 MED ORDER — SODIUM CHLORIDE 0.9% FLUSH
3.0000 mL | Freq: Two times a day (BID) | INTRAVENOUS | Status: DC
  Administered 2022-01-26 – 2022-01-28 (×4): 3 mL via INTRAVENOUS

## 2022-01-26 MED ORDER — POTASSIUM CHLORIDE CRYS ER 20 MEQ PO TBCR
40.0000 meq | EXTENDED_RELEASE_TABLET | Freq: Once | ORAL | Status: AC
  Administered 2022-01-26: 40 meq via ORAL
  Filled 2022-01-26: qty 2

## 2022-01-26 MED ORDER — ENOXAPARIN SODIUM 40 MG/0.4ML IJ SOSY
40.0000 mg | PREFILLED_SYRINGE | INTRAMUSCULAR | Status: DC
  Administered 2022-01-26 – 2022-01-27 (×2): 40 mg via SUBCUTANEOUS
  Filled 2022-01-26 (×2): qty 0.4

## 2022-01-26 MED ORDER — ACETAMINOPHEN 325 MG PO TABS: 650.0000 mg | ORAL_TABLET | ORAL | Status: DC | PRN

## 2022-01-26 MED ORDER — ASPIRIN 81 MG PO CHEW
81.0000 mg | CHEWABLE_TABLET | Freq: Once | ORAL | Status: AC
  Administered 2022-01-26: 81 mg via ORAL
  Filled 2022-01-26: qty 1

## 2022-01-26 MED ORDER — SODIUM CHLORIDE 0.9% FLUSH: 3.0000 mL | INTRAVENOUS | Status: DC | PRN

## 2022-01-26 MED ORDER — FUROSEMIDE 10 MG/ML IJ SOLN
20.0000 mg | Freq: Once | INTRAMUSCULAR | Status: AC
  Administered 2022-01-26: 20 mg via INTRAVENOUS
  Filled 2022-01-26: qty 2

## 2022-01-26 MED ORDER — FUROSEMIDE 10 MG/ML IJ SOLN
20.0000 mg | Freq: Two times a day (BID) | INTRAMUSCULAR | Status: DC
  Administered 2022-01-26 – 2022-01-27 (×2): 20 mg via INTRAVENOUS
  Filled 2022-01-26 (×2): qty 2

## 2022-01-26 NOTE — Progress Notes (Signed)
? ?  Echocardiogram ?2D Echocardiogram has been performed. ? ?[name] [name] ?01/26/2022, 2:14 PM ?

## 2022-01-26 NOTE — TOC Progression Note (Signed)
Transition of Care (TOC) - Progression Note  ? ? ?Patient Details  ?Name: [name] [name] ?MRN: [id number] ?Date of Birth: [birth year] ? ?Transition of Care (TOC) CM/SW Contact  ?Leone Haven, RN ?Phone Number: ?01/26/2022, 4:42 PM ? ?Clinical Narrative:    ? ?Transition of Care (TOC) Screening Note ? ? ?Patient Details  ?Name: [name] [name] ?Date of Birth: 1991/01/25 ? ? ?Transition of Care (TOC) CM/SW Contact:    ?Leone Haven, RN ?Phone Number: ?01/26/2022, 4:42 PM ? ? ? ?Transition of Care Department [facility]) has reviewed patient and no TOC needs have been identified at this time. We will continue to monitor patient advancement through interdisciplinary progression rounds. If new patient transition needs arise, please place a TOC consult. ?  ? ? ?  ?  ? ?Expected Discharge Plan and Services ?  ?  ?  ?  ?  ?                ?  ?  ?  ?  ?  ?  ?  ?  ?  ?  ? ? ?Social Determinants of Health (SDOH) Interventions ?  ? ?Readmission Risk Interventions ?   ? View : No data to display.  ?  ?  ?  ? ? ?

## 2022-01-26 NOTE — ED Notes (Signed)
Provider at bedside

## 2022-01-26 NOTE — ED Triage Notes (Addendum)
[facility] reports pt coming from home c/o sob and chest pain. Family states she was not responding right to them. Upon EMS arrival pt able to answer questions. Stroke screen negative with EMS. ?

## 2022-01-26 NOTE — Consult Note (Signed)
CARDIOLOGY CONSULT NOTE  ?Patient ID: ?[name] [name] ?MRN: [id number] ?DOB/AGE: 08-Nov-1990 30 y.o. ? ?Admit date: 01/26/2022 ?Attending physician: Emeline General, MD ?Primary Physician:  Patient, No Pcp Per (Inactive) ?Outpatient Cardiologist: None ?Inpatient Cardiologist: Tessa Lerner, DO, [facility] ? ?Reason of consultation: Shortness of breath and Chest Pain  ?Referring physician: Emeline General, MD ? ?Chief complaint: chest pain and shortness of breath ? ?HPI:  ?[name] [name] is a 31 y.o.  female who presents with a chief complaint of " chest pain and shortness of breath."  No significant past cardiac history. ? ?Patient speaks fragmented English as her native language is [place] (which is [place] language) official translation is not available and no family present at bedside who can help translate. ? ?Patient was in her usual state of health yesterday.  Unfortunately had lost uncle and was at a funeral ceremony until 3 AM this morning.  After getting home patient states that she got her kids ready and dropped them off to school at 6 AM and after coming home started experiencing chest pain.  Chest pain is located on the left side, initially in intensity was 10 out of 10, nonradiating, nonexertional, did not resolve with rest.  Associated symptoms also included shortness of breath.  Since she was not consolable at home by her husband and mother-in-law EMS was called and patient was brought to the hospital for further evaluation. ? ?During the work-up in the ED patient is noted to have an elevated D-dimer and subsequently underwent CT PE protocol which was reported to have no pulmonary embolism.  Additional work-up noted elevated beta-hCG and a fetal ultrasound notes a viable pregnancy. ? ?Cardiology is consulted during this hospitalization due to elevated troponins, chest pain and shortness of breath.  At the time of evaluation patient denies any chest discomfort and her shortness of breath is improved markedly.   Patient denies any sick contacts; however, she does work in a assisted living facility.  ? ?Patient is a mother of 4 kids the eldest being 21 years old and the youngest being 31 years old.  During her last 2 pregnancies both of her kids were premature and had to undergo C-section.  She denies any postpartum cardiomyopathy.  To the best of her knowledge no family history of premature coronary artery disease, sudden cardiac death, or cardiomyopathy. ? ?Patient denies the consumption of illicit drugs or alcohol. ? ?ALLERGIES: ?No Known Allergies ? ?PAST MEDICAL HISTORY: ?Past Medical History:  ?Diagnosis Date  ? Breech presentation without mention of version, delivered 12/25/2013  ? Medical history non-contributory   ? S/P emergency cesarean section 12/25/2013  ? ? ?PAST SURGICAL HISTORY: ?Past Surgical History:  ?Procedure Laterality Date  ? CESAREAN SECTION N/A 12/25/2013  ? Procedure: Primary Cesarean Section Delivery Baby Girl @ 0542, Apgars;  Surgeon: Sherron Monday, MD;  Location: [facility];  Service: Obstetrics;  Laterality: N/A;  ? CESAREAN SECTION N/A 12/14/2015  ? Procedure: CESAREAN SECTION;  Surgeon: Adam Phenix, MD;  Location: [facility];  Service: Obstetrics;  Laterality: N/A;  ? NO PAST SURGERIES    ? ? ?FAMILY HISTORY: ?Denies family history of premature CAD, sudden cardiac death, or cardiomyopathy. ?  ?SOCIAL HISTORY:  ?The patient  reports that she has never smoked. She has never used smokeless tobacco. She reports that she does not drink alcohol and does not use drugs. ? ?MEDICATIONS: ?Current Outpatient Medications  ?Medication Instructions  ? albuterol (VENTOLIN HFA) 108 (90 Base) MCG/ACT inhaler 1-2 puffs, Inhalation, Every  6 hours PRN  ? amoxicillin-clavulanate (AUGMENTIN) 875-125 MG tablet 1 tablet, Oral, Every 12 hours  ? ibuprofen (ADVIL) 600 mg, Oral, Every 6 hours PRN  ? promethazine-dextromethorphan (PROMETHAZINE-DM) 6.25-15 MG/5ML syrup 5 mLs, Oral, 2 times daily PRN  ? ? ?REVIEW OF SYSTEMS: ?Review of  Systems  ?Constitutional: Negative for chills and fever.  ?HENT:  Negative for hoarse voice and nosebleeds.   ?Eyes:  Negative for discharge, double vision and pain.  ?Cardiovascular:  Positive for chest pain and palpitations. Negative for claudication, dyspnea on exertion, leg swelling, near-syncope, orthopnea, paroxysmal nocturnal dyspnea and syncope.  ?Respiratory:  Positive for shortness of breath. Negative for hemoptysis.   ?Musculoskeletal:  Negative for muscle cramps and myalgias.  ?Gastrointestinal:  Negative for abdominal pain, constipation, diarrhea, hematemesis, hematochezia, melena, nausea and vomiting.  ?Neurological:  Negative for dizziness and light-headedness.  ? ?PHYSICAL EXAM: ? ?  01/26/2022  ?  4:46 PM 01/26/2022  ?  3:30 PM 01/26/2022  ?  3:00 PM  ?Vitals with BMI  ?Height 4\' 9"     ?Weight 194 lbs 14 oz    ?BMI 42.16    ?Systolic 114 103 [id number]  ?Diastolic 73 77 89  ?Pulse  90 91  ? ? ? ?Intake/Output Summary (Last 24 hours) at 01/26/2022 1806 ?Last data filed at 01/26/2022 1459 ?Gross per 24 hour  ?Intake --  ?Output 350 ml  ?Net -350 ml  ?  ?Net IO Since Admission: -350 mL [01/26/22 1806] ? ?CONSTITUTIONAL: Age-appropriate female, hemodynamically stable, well-developed and well-nourished. No acute distress.  ?SKIN: Skin is warm and dry. No rash noted. No cyanosis. No pallor. No jaundice ?HEAD: Normocephalic and atraumatic.  ?EYES: No scleral icterus ?MOUTH/THROAT: Moist oral membranes.  ?NECK: No JVD present. No thyromegaly noted. No carotid bruits  ?LYMPHATIC: No visible cervical adenopathy.  ?CHEST Normal respiratory effort. No intercostal retractions  ?LUNGS: Clear to auscultation bilaterally.  No stridor. No wheezes. No rales.  ?CARDIOVASCULAR: Regular rate and rhythm, positive S1-S2, no murmurs rubs or gallops appreciated ?ABDOMINAL: Obese, soft, nontender, nondistended, positive bowel sounds in all 4 quadrants, no apparent ascites.  ?EXTREMITIES: No pitting edema, warm to touch.  ?HEMATOLOGIC:  No significant bruising ?NEUROLOGIC: Oriented to person, place, and time. Nonfocal. Normal muscle tone.  ?PSYCHIATRIC: Normal mood and affect. Normal behavior. Cooperative ? ?RADIOLOGY: ?CT Angio Chest PE W and/or Wo Contrast ? ?Result Date: 01/26/2022 ?CLINICAL DATA:  31 year old female with possible pulmonary embolism EXAM: CT ANGIOGRAPHY CHEST WITH CONTRAST TECHNIQUE: Multidetector CT imaging of the chest was performed using the standard protocol during bolus administration of intravenous contrast. Multiplanar CT image reconstructions and MIPs were obtained to evaluate the vascular anatomy. RADIATION DOSE REDUCTION: This exam was performed according to the departmental dose-optimization program which includes automated exposure control, adjustment of the mA and/or kV according to patient size and/or use of iterative reconstruction technique. CONTRAST:  50mL OMNIPAQUE IOHEXOL 350 MG/ML SOLN COMPARISON:  None. FINDINGS: Cardiovascular: Heart: No cardiomegaly. No pericardial fluid/thickening. No significant coronary calcifications. Aorta: Unremarkable course, caliber, contour of the thoracic aorta. No aneurysm or dissection flap. No periaortic fluid. Pulmonary arteries: No central, lobar, segmental, or proximal subsegmental filling defects. Mediastinum/Nodes: Hiatal hernia. Small lymph nodes adjacent to the GE junction and distal esophagus with some associated circumferential esophageal thickening. No comparison available. Unremarkable thoracic inlet. Lungs/Pleura: Interlobular septal thickening and thickening of the fissures. Bronchial wall thickening with no endobronchial debris. No significant airspace disease. No pleural effusion or pneumothorax. Airways are clear. Upper Abdomen: No acute finding  of the upper abdomen. Musculoskeletal: No acute displaced fracture. Degenerative changes of the spine. Review of the MIP images confirms the above findings. IMPRESSION: Negative for pulmonary embolism. Interstitial  pulmonary edema. Distal esophageal wall thickening associated with small hiatal hernia and small lymph nodes in the mediastinum. Correlation with a history of symptoms of GERD may be useful. Electronically Si

## 2022-01-26 NOTE — ED Notes (Signed)
ED TO INPATIENT HANDOFF REPORT ? ?ED Nurse Name and Phone #: Baxter Flattery, RN ? ?S ?Name/Age/Gender ?[name] [name] ?31 y.o. ?female ?Room/Bed: 024C/024C ? ?Code Status ?  Code Status: Full Code ? ?Home/SNF/Other ?Home ?Patient oriented to: self, place, time, and situation ?Is this baseline? Yes  ? ?Triage Complete: Triage complete  ?Chief Complaint ?CHF (congestive heart failure) ([place]) [I50.9] ? ?Triage Note ?[facility] reports pt coming from home c/o sob and chest pain. Family states she was not responding right to them. Upon EMS arrival pt able to answer questions. Stroke screen negative with EMS.  ? ?Allergies ?No Known Allergies ? ?Level of Care/Admitting Diagnosis ?ED Disposition   ? ? ED Disposition  ?Admit  ? Condition  ?--  ? Comment  ?Hospital Area: [facility] [id number] ? Level of Care: Telemetry Medical [104] ? May admit patient to [facility] or [facility] if equivalent level of care is available:: No ? Covid Evaluation: Asymptomatic - no recent exposure (last 10 days) testing not required ? Diagnosis: CHF (congestive heart failure) ([place]) [id number] ? Admitting Physician: Lequita Halt [id number] ? Attending Physician: Lequita Halt [id number] ? Estimated length of stay: past midnight tomorrow ? Certification:: I certify this patient will need inpatient services for at least 2 midnights ?  ?  ? ?  ? ? ?B ?Medical/Surgery History ?Past Medical History:  ?Diagnosis Date  ? Breech presentation without mention of version, delivered 12/25/2013  ? Medical history non-contributory   ? S/P emergency cesarean section 12/25/2013  ? ?Past Surgical History:  ?Procedure Laterality Date  ? CESAREAN SECTION N/A 12/25/2013  ? Procedure: Primary Cesarean Section Delivery Baby Girl @ [id number], Apgars;  Surgeon: Thornell Sartorius, MD;  Location: [facility];  Service: Obstetrics;  Laterality: N/A;  ? CESAREAN SECTION N/A 12/14/2015  ? Procedure: CESAREAN SECTION;  Surgeon: Woodroe Mode, MD;  Location: [facility];  Service: Obstetrics;   Laterality: N/A;  ? NO PAST SURGERIES    ?  ? ?A ?IV Location/Drains/Wounds ?Patient Lines/Drains/Airways Status   ? ? Active Line/Drains/Airways   ? ? Name Placement date Placement time Site Days  ? Peripheral IV 01/26/22 20 G 1" Anterior;Left Forearm 01/26/22  --  Forearm  less than 1  ? Incision (Closed) 12/25/13 Abdomen Other (Comment) 12/25/13  0645  -- [id number]  ? Incision (Closed) 12/14/15 Abdomen 12/14/15  1308  -- 2235  ? ?  ?  ? ?  ? ? ?Intake/Output Last 24 hours ? ?Intake/Output Summary (Last 24 hours) at 01/26/2022 1501 ?Last data filed at 01/26/2022 1459 ?Gross per 24 hour  ?Intake --  ?Output 350 ml  ?Net -350 ml  ? ? ?Labs/Imaging ?Results for orders placed or performed during the hospital encounter of 01/26/22 (from the past 48 hour(s))  ?CBC with Differential     Status: Abnormal  ? Collection Time: 01/26/22  8:15 AM  ?Result Value Ref Range  ? WBC 13.5 (H) 4.0 - 10.5 K/uL  ? RBC 4.31 3.87 - 5.11 MIL/uL  ? Hemoglobin 13.5 12.0 - 15.0 g/dL  ? HCT 39.4 36.0 - 46.0 %  ? MCV 91.4 80.0 - 100.0 fL  ? MCH 31.3 26.0 - 34.0 pg  ? MCHC 34.3 30.0 - 36.0 g/dL  ? RDW 12.4 11.5 - 15.5 %  ? Platelets 244 150 - 400 K/uL  ? nRBC 0.0 0.0 - 0.2 %  ? Neutrophils Relative % 77 %  ? Neutro Abs 10.4 (H) 1.7 - 7.7 K/uL  ? Lymphocytes  Relative 15 %  ? Lymphs Abs 2.0 0.7 - 4.0 K/uL  ? Monocytes Relative 5 %  ? Monocytes Absolute 0.7 0.1 - 1.0 K/uL  ? Eosinophils Relative 2 %  ? Eosinophils Absolute 0.2 0.0 - 0.5 K/uL  ? Basophils Relative 0 %  ? Basophils Absolute 0.0 0.0 - 0.1 K/uL  ? Immature Granulocytes 1 %  ? Abs Immature Granulocytes 0.09 (H) 0.00 - 0.07 K/uL  ?  Comment: Performed at [facility], [place] [address] [place], [place] [address]  ?Comprehensive metabolic panel     Status: Abnormal  ? Collection Time: 01/26/22  8:15 AM  ?Result Value Ref Range  ? Sodium 133 (L) 135 - 145 mmol/L  ? Potassium 3.2 (L) 3.5 - 5.1 mmol/L  ? Chloride 103 98 - 111 mmol/L  ? CO2 20 (L) 22 - 32 mmol/L  ? Glucose, Bld 122 (H) 70 - 99  mg/dL  ?  Comment: Glucose reference range applies only to samples taken after fasting for at least 8 hours.  ? BUN 8 6 - 20 mg/dL  ? Creatinine, Ser 0.63 0.44 - 1.00 mg/dL  ? Calcium 8.6 (L) 8.9 - 10.3 mg/dL  ? Total Protein 6.6 6.5 - 8.1 g/dL  ? Albumin 3.2 (L) 3.5 - 5.0 g/dL  ? AST 25 15 - 41 U/L  ? ALT 23 0 - 44 U/L  ? Alkaline Phosphatase 75 38 - 126 U/L  ? Total Bilirubin 0.4 0.3 - 1.2 mg/dL  ? GFR, Estimated >60 >60 mL/min  ?  Comment: (NOTE) ?Calculated using the CKD-EPI Creatinine Equation (2021) ?  ? Anion gap 10 5 - 15  ?  Comment: Performed at [facility], [place] [address] [place],  [address]  ?Troponin I (High Sensitivity)     Status: Abnormal  ? Collection Time: 01/26/22  8:15 AM  ?Result Value Ref Range  ? Troponin I (High Sensitivity) 75 (H) <18 ng/L  ?  Comment: (NOTE) ?Elevated high sensitivity troponin I (hsTnI) values and significant  ?changes across serial measurements may suggest ACS but many other  ?chronic and acute conditions are known to elevate hsTnI results.  ?Refer to the "Links" section for chest pain algorithms and additional  ?guidance. ?Performed at [facility], [place] [address] [place], [place] ?[address] ?  ?D-dimer, quantitative     Status: Abnormal  ? Collection Time: 01/26/22  8:15 AM  ?Result Value Ref Range  ? D-Dimer, Quant 1.13 (H) 0.00 - 0.50 ug/mL-FEU  ?  Comment: (NOTE) ?At the manufacturer cut-off value of 0.5 ?g/mL FEU, this assay has a ?negative predictive value of 95-100%.This assay is intended for use ?in conjunction with a clinical pretest probability (PTP) assessment ?model to exclude pulmonary embolism (PE) and deep venous thrombosis ?(DVT) in outpatients suspected of PE or DVT. ?Results should be correlated with clinical presentation. ?Performed at [facility], [place] [address], [place], [place] ?[address] ?  ?hCG, quantitative, pregnancy     Status: Abnormal  ? Collection Time: 01/26/22  8:15 AM  ?Result Value Ref Range  ? hCG, Beta  Chain, Quant, S 111,841 (H) <5 mIU/mL  ?  Comment:        ?  GEST. AGE      CONC.  (mIU/mL) ?  <=1 WEEK        5 - 50 ?    2 WEEKS       50 - 500 ?    3  WEEKS       100 - 10,000 ?    4 WEEKS     1,000 - 30,000 ?    5 WEEKS     3,500 - 115,000 ?  6-8 WEEKS     12,000 - 270,000 ?   12 WEEKS     15,000 - 220,000 ?       ?FEMALE AND NON-PREGNANT FEMALE: ?    LESS THAN 5 mIU/mL ?Performed at [facility], [place] [address], [place], [place] [address] ?  ?Brain natriuretic peptide     Status: None  ? Collection Time: 01/26/22  8:15 AM  ?Result Value Ref Range  ? B Natriuretic Peptide 24.8 0.0 - 100.0 pg/mL  ?  Comment: Performed at [facility], [place] [address] [place], [place] [address]  ?Resp Panel by RT-PCR (Flu A&B, Covid) Nasopharyngeal Swab     Status: None  ? Collection Time: 01/26/22  9:32 AM  ? Specimen: Nasopharyngeal Swab; Nasopharyngeal(NP) swabs in vial transport medium  ?Result Value Ref Range  ? SARS Coronavirus 2 by RT PCR NEGATIVE NEGATIVE  ?  Comment: (NOTE) ?SARS-CoV-2 target nucleic acids are NOT DETECTED. ? ?The SARS-CoV-2 RNA is generally detectable in upper respiratory ?specimens during the acute phase of infection. The lowest ?concentration of SARS-CoV-2 viral copies this assay can detect is ?138 copies/mL. A negative result does not preclude SARS-Cov-2 ?infection and should not be used as the sole basis for treatment or ?other patient management decisions. A negative result may occur with  ?improper specimen collection/handling, submission of specimen other ?than nasopharyngeal swab, presence of viral mutation(s) within the ?areas targeted by this assay, and inadequate number of viral ?copies(<138 copies/mL). A negative result must be combined with ?clinical observations, patient history, and epidemiological ?information. The expected result is Negative. ? ?Fact Sheet for Patients:  ?EntrepreneurPulse.com.au ? ?Fact Sheet for Healthcare Providers:   ?IncredibleEmployment.be ? ?This test is no t yet approved or cleared by the [place] FDA and  ?has been authorized for detection and/or diagnosis of SARS-CoV-2 by ?FDA under an Emergency Use Authorization (EUA). Thi

## 2022-01-26 NOTE — ED Provider Notes (Signed)
?[facility] EMERGENCY DEPARTMENT ?Provider Note ? ? ?CSN: [id number] ?Arrival date & time: 01/26/22  7948 ? ?  ? ?History ? ?Chief Complaint  ?Patient presents with  ? Shortness of Breath  ? ? ?[name] [name] is a 31 y.o. female. ? ?HPI ? ?  ? ?31yo female presents with concern for chest pain, dyspnea.  ? ?She has had a very mild cough and congestion starting yesterday, today woke up and began to have sensation of chest pressure and shortness of breath.  The chest pressure right now is rated a 4 out of 10 in severity.  Denies pain that worsens with exertion, pleuritic pain.  Nothing really seems to make it better or worse.  Has associated shortness of breath and lightheadedness with a near syncopal episode.  Denies nausea, vomiting, abdominal pain, asymmetric leg pain or swelling.  No focal numbness, weakness, facial droop, headache. ? ?They drove to [place] last weekend.  Otherwise denies any long trips, surgeries, immobilization.  She is on estrogen via implanon.  ?No history of DVT or PE, family history of DVT or PE, family history of coronary artery disease. ?Denies history of smoking, alcohol or other drug use, denies any other medical history. ? ? ?Past Medical History:  ?Diagnosis Date  ? Breech presentation without mention of version, delivered 12/25/2013  ? Medical history non-contributory   ? S/P emergency cesarean section 12/25/2013  ?  ? ?Home Medications ?Prior to Admission medications   ?Medication Sig Start Date End Date Taking? Authorizing Provider  ?albuterol (VENTOLIN HFA) 108 (90 Base) MCG/ACT inhaler Inhale 1-2 puffs into the lungs every 6 (six) hours as needed for wheezing or shortness of breath. ?Patient not taking: Reported on 01/26/2022 09/19/21   Rhys Martini, PA-C  ?amoxicillin-clavulanate (AUGMENTIN) 875-125 MG tablet Take 1 tablet by mouth every 12 (twelve) hours. ?Patient not taking: Reported on 01/26/2022 09/19/21   Rhys Martini, PA-C  ?ibuprofen (ADVIL) 600 MG  tablet Take 1 tablet (600 mg total) by mouth every 6 (six) hours as needed. ?Patient not taking: Reported on 01/26/2022 11/21/20   Domenick Gong, MD  ?promethazine-dextromethorphan (PROMETHAZINE-DM) 6.25-15 MG/5ML syrup Take 5 mLs by mouth 2 (two) times daily as needed for cough. ?Patient not taking: Reported on 01/26/2022 12/30/21   Raspet, Noberto Retort, PA-C  ?   ? ?Allergies    ?Patient has no known allergies.   ? ?Review of Systems   ?Review of Systems ?See above ? ?Physical Exam ?Updated Vital Signs ?BP 103/77   Pulse 90   Temp 98.4 ?F (36.9 ?C) (Oral)   Resp 17   Ht 5' (1.524 m)   Wt 77.1 kg   SpO2 99%   BMI 33.20 kg/m?  ?Physical Exam ?Vitals and nursing note reviewed.  ?Constitutional:   ?   General: She is not in acute distress. ?   Appearance: She is well-developed. She is not diaphoretic.  ?HENT:  ?   Head: Normocephalic and atraumatic.  ?Eyes:  ?   Conjunctiva/sclera: Conjunctivae normal.  ?Cardiovascular:  ?   Rate and Rhythm: Normal rate and regular rhythm.  ?   Heart sounds: Normal heart sounds. No murmur heard. ?  No friction rub. No gallop.  ?Pulmonary:  ?   Effort: Pulmonary effort is normal. No respiratory distress.  ?   Breath sounds: Normal breath sounds. No wheezing or rales.  ?Abdominal:  ?   General: There is no distension.  ?   Palpations: Abdomen is soft.  ?  Tenderness: There is no abdominal tenderness. There is no guarding.  ?Musculoskeletal:     ?   General: No tenderness.  ?   Cervical back: Normal range of motion.  ?Skin: ?   General: Skin is warm and dry.  ?   Findings: No erythema or rash.  ?Neurological:  ?   Mental Status: She is alert and oriented to person, place, and time.  ? ? ?ED Results / Procedures / Treatments   ?Labs ?(all labs ordered are listed, but only abnormal results are displayed) ?Labs Reviewed  ?CBC WITH DIFFERENTIAL/PLATELET - Abnormal; Notable for the following components:  ?    Result Value  ? WBC 13.5 (*)   ? Neutro Abs 10.4 (*)   ? Abs Immature Granulocytes  0.09 (*)   ? All other components within normal limits  ?COMPREHENSIVE METABOLIC PANEL - Abnormal; Notable for the following components:  ? Sodium 133 (*)   ? Potassium 3.2 (*)   ? CO2 20 (*)   ? Glucose, Bld 122 (*)   ? Calcium 8.6 (*)   ? Albumin 3.2 (*)   ? All other components within normal limits  ?D-DIMER, QUANTITATIVE - Abnormal; Notable for the following components:  ? D-Dimer, Quant 1.13 (*)   ? All other components within normal limits  ?HCG, QUANTITATIVE, PREGNANCY - Abnormal; Notable for the following components:  ? hCG, Beta Francene FindersChain, Quant, S [phone] (*)   ? All other components within normal limits  ?I-STAT BETA HCG BLOOD, ED ([facility], [facility], [facility] ONLY) - Abnormal; Notable for the following components:  ? I-stat hCG, quantitative >2,000.0 (*)   ? All other components within normal limits  ?TROPONIN I (HIGH SENSITIVITY) - Abnormal; Notable for the following components:  ? Troponin I (High Sensitivity) 75 (*)   ? All other components within normal limits  ?TROPONIN I (HIGH SENSITIVITY) - Abnormal; Notable for the following components:  ? Troponin I (High Sensitivity) 391 (*)   ? All other components within normal limits  ?TROPONIN I (HIGH SENSITIVITY) - Abnormal; Notable for the following components:  ? Troponin I (High Sensitivity) 286 (*)   ? All other components within normal limits  ?RESP PANEL BY RT-PCR (FLU A&B, COVID) ARPGX2  ?BRAIN NATRIURETIC PEPTIDE  ?HIV ANTIBODY (ROUTINE TESTING W REFLEX)  ?HEMOGLOBIN A1C  ?C-REACTIVE PROTEIN  ?SEDIMENTATION RATE  ?COXSACKIE A VIRUS ANTIBODIES  ?COXSACKIE B VIRUS ANTIBODIES  ?TSH  ?TYPE AND SCREEN  ?TROPONIN I (HIGH SENSITIVITY)  ? ? ?EKG ?EKG Interpretation ? ?Date/Time:  Friday January 26 2022 13:48:13 EDT ?Ventricular Rate:  76 ?PR Interval:  147 ?QRS Duration: 85 ?QT Interval:  387 ?QTC Calculation: 436 ?R Axis:   66 ?Text Interpretation: Sinus rhythm No significant change since last tracing Confirmed by Alvira MondaySchlossman, Faelyn Sigler ([id number]) on 01/26/2022 3:54:46 PM ? ?Radiology ?CT  Angio Chest PE W and/or Wo Contrast ? ?Result Date: 01/26/2022 ?CLINICAL DATA:  31 year old female with possible pulmonary embolism EXAM: CT ANGIOGRAPHY CHEST WITH CONTRAST TECHNIQUE: Multidetector CT imaging of the chest was performed using the standard protocol during bolus administration of intravenous contrast. Multiplanar CT image reconstructions and MIPs were obtained to evaluate the vascular anatomy. RADIATION DOSE REDUCTION: This exam was performed according to the departmental dose-optimization program which includes automated exposure control, adjustment of the mA and/or kV according to patient size and/or use of iterative reconstruction technique. CONTRAST:  50mL OMNIPAQUE IOHEXOL 350 MG/ML SOLN COMPARISON:  None. FINDINGS: Cardiovascular: Heart: No cardiomegaly. No pericardial fluid/thickening. No significant coronary calcifications. Aorta:  Unremarkable course, caliber, contour of the thoracic aorta. No aneurysm or dissection flap. No periaortic fluid. Pulmonary arteries: No central, lobar, segmental, or proximal subsegmental filling defects. Mediastinum/Nodes: Hiatal hernia. Small lymph nodes adjacent to the GE junction and distal esophagus with some associated circumferential esophageal thickening. No comparison available. Unremarkable thoracic inlet. Lungs/Pleura: Interlobular septal thickening and thickening of the fissures. Bronchial wall thickening with no endobronchial debris. No significant airspace disease. No pleural effusion or pneumothorax. Airways are clear. Upper Abdomen: No acute finding of the upper abdomen. Musculoskeletal: No acute displaced fracture. Degenerative changes of the spine. Review of the MIP images confirms the above findings. IMPRESSION: Negative for pulmonary embolism. Interstitial pulmonary edema. Distal esophageal wall thickening associated with small hiatal hernia and small lymph nodes in the mediastinum. Correlation with a history of symptoms of GERD may be useful.  Electronically Signed   By: Gilmer Mor D.O.   On: 01/26/2022 12:15  ? ?DG Chest Portable 1 View ? ?Result Date: 01/26/2022 ?CLINICAL DATA:  Chest pain. EXAM: PORTABLE CHEST 1 VIEW COMPARISON:  September 19, 2021.

## 2022-01-26 NOTE — H&P (Addendum)
?History and Physical  ? ? ?[name] [name] TIR:[id number] DOB: [birth year] DOA: 01/26/2022 ? ?PCP: Patient, No Pcp Per (Inactive) (Confirm with patient/family/NH records and if not entered, this has to be entered at [facility] point of entry) ?Patient coming from: Home ? ?I have personally briefly reviewed patient's old medical records in [facility] ? ?Chief Complaint: Chest pain, SOB ? ?HPI: [name] [name] is a 31 y.o. female with medical history significant of obesity, presented with chest pain and shortness of breath. ? ?Symptoms started this morning, patient was at rest, then developed this 5/10 pressure-like chest pain centrally located, nonradiating, associated with shortness of breath and lightheadedness.  No alleviating or exacerbating factors, chest pain lasted about an hour.  At the time I saw her, she still has 2-3/10 chest pain.  No leg swelling, denies any tobacco use, no drug use. ? ?Patient also reported that this morning she also noticed some spotting on her underwear.  Denies any abdominal pain. ? ?ED Course: No tachycardia no hypotension no hypoxia.  CT angiogram negative for PE.  Pregnancy test positive.  Troponin 75.  BNP within normal limits.  EKG, no acute ST changes. ? ?Pelvic ultrasound showed single live intrauterine gestation 12 weeks ? ?Review of Systems: As per HPI otherwise 14 point review of systems negative.  ? ? ?Past Medical History:  ?Diagnosis Date  ? Breech presentation without mention of version, delivered 12/25/2013  ? Medical history non-contributory   ? S/P emergency cesarean section 12/25/2013  ? ? ?Past Surgical History:  ?Procedure Laterality Date  ? CESAREAN SECTION N/A 12/25/2013  ? Procedure: Primary Cesarean Section Delivery Baby Girl @ 0542, Apgars;  Surgeon: Sherron Monday, MD;  Location: [facility];  Service: Obstetrics;  Laterality: N/A;  ? CESAREAN SECTION N/A 12/14/2015  ? Procedure: CESAREAN SECTION;  Surgeon: Adam Phenix, MD;  Location: [facility];  Service: Obstetrics;   Laterality: N/A;  ? NO PAST SURGERIES    ? ? ? reports that she has never smoked. She has never used smokeless tobacco. She reports that she does not drink alcohol and does not use drugs. ? ?No Known Allergies ? ?Family History  ?Problem Relation Age of Onset  ? Cancer Neg Hx   ? Diabetes Neg Hx   ? Kidney disease Neg Hx   ? ? ? ?Prior to Admission medications   ?Medication Sig Start Date End Date Taking? Authorizing Provider  ?albuterol (VENTOLIN HFA) 108 (90 Base) MCG/ACT inhaler Inhale 1-2 puffs into the lungs every 6 (six) hours as needed for wheezing or shortness of breath. ?Patient not taking: Reported on 01/26/2022 09/19/21   Rhys Martini, PA-C  ?amoxicillin-clavulanate (AUGMENTIN) 875-125 MG tablet Take 1 tablet by mouth every 12 (twelve) hours. ?Patient not taking: Reported on 01/26/2022 09/19/21   Rhys Martini, PA-C  ?ibuprofen (ADVIL) 600 MG tablet Take 1 tablet (600 mg total) by mouth every 6 (six) hours as needed. ?Patient not taking: Reported on 01/26/2022 11/21/20   Domenick Gong, MD  ?promethazine-dextromethorphan (PROMETHAZINE-DM) 6.25-15 MG/5ML syrup Take 5 mLs by mouth 2 (two) times daily as needed for cough. ?Patient not taking: Reported on 01/26/2022 12/30/21   Raspet, Noberto Retort, PA-C  ? ? ?Physical Exam: ?Vitals:  ? 01/26/22 0930 01/26/22 1000 01/26/22 1030 01/26/22 1247  ?BP: 114/71 109/78 116/72 120/82  ?Pulse: 80 79 79 90  ?Resp: (!) 21 (!) 22 20 17   ?Temp:      ?TempSrc:      ?SpO2: 98% 98% 98% 99%  ?  Weight:      ?Height:      ? ? ?Constitutional: NAD, calm, comfortable ?Vitals:  ? 01/26/22 0930 01/26/22 1000 01/26/22 1030 01/26/22 1247  ?BP: 114/71 109/78 116/72 120/82  ?Pulse: 80 79 79 90  ?Resp: (!) 21 (!) 22 20 17   ?Temp:      ?TempSrc:      ?SpO2: 98% 98% 98% 99%  ?Weight:      ?Height:      ? ?Eyes: PERRL, lids and conjunctivae normal ?ENMT: Mucous membranes are moist. Posterior pharynx clear of any exudate or lesions.Normal dentition.  ?Neck: normal, supple, no masses, no  thyromegaly ?Respiratory: clear to auscultation bilaterally, no wheezing, fine crackles on B/L bases. Normal respiratory effort. No accessory muscle use.  ?Cardiovascular: Regular rate and rhythm, no murmurs / rubs / gallops. No extremity edema. 2+ pedal pulses. No carotid bruits. Tenderness on mid sternum ?Abdomen: no tenderness, no masses palpated. No hepatosplenomegaly. Bowel sounds positive.  ?Musculoskeletal: no clubbing / cyanosis. No joint deformity upper and lower extremities. Good ROM, no contractures. Normal muscle tone.  ?Skin: no rashes, lesions, ulcers. No induration ?Neurologic: CN 2-12 grossly intact. Sensation intact, DTR normal. Strength 5/5 in all 4.  ?Psychiatric: Normal judgment and insight. Alert and oriented x 3. Normal mood.  ? ? ? ?Labs on Admission: I have personally reviewed following labs and imaging studies ? ?CBC: ?Recent Labs  ?Lab 01/26/22 ?0815  ?WBC 13.5*  ?NEUTROABS 10.4*  ?HGB 13.5  ?HCT 39.4  ?MCV 91.4  ?PLT 244  ? ?Basic Metabolic Panel: ?Recent Labs  ?Lab 01/26/22 ?0815  ?NA 133*  ?K 3.2*  ?CL 103  ?CO2 20*  ?GLUCOSE 122*  ?BUN 8  ?CREATININE 0.63  ?CALCIUM 8.6*  ? ?GFR: ?Estimated Creatinine Clearance: 94.3 mL/min (by C-G formula based on SCr of 0.63 mg/dL). ?Liver Function Tests: ?Recent Labs  ?Lab 01/26/22 ?0815  ?AST 25  ?ALT 23  ?ALKPHOS 75  ?BILITOT 0.4  ?PROT 6.6  ?ALBUMIN 3.2*  ? ?No results for input(s): LIPASE, AMYLASE in the last 168 hours. ?No results for input(s): AMMONIA in the last 168 hours. ?Coagulation Profile: ?No results for input(s): INR, PROTIME in the last 168 hours. ?Cardiac Enzymes: ?No results for input(s): CKTOTAL, CKMB, CKMBINDEX, TROPONINI in the last 168 hours. ?BNP (last 3 results) ?No results for input(s): PROBNP in the last 8760 hours. ?HbA1C: ?No results for input(s): HGBA1C in the last 72 hours. ?CBG: ?No results for input(s): GLUCAP in the last 168 hours. ?Lipid Profile: ?No results for input(s): CHOL, HDL, LDLCALC, TRIG, CHOLHDL, LDLDIRECT in  the last 72 hours. ?Thyroid Function Tests: ?No results for input(s): TSH, T4TOTAL, FREET4, T3FREE, THYROIDAB in the last 72 hours. ?Anemia Panel: ?No results for input(s): VITAMINB12, FOLATE, FERRITIN, TIBC, IRON, RETICCTPCT in the last 72 hours. ?Urine analysis: ?   ?Component Value Date/Time  ? COLORURINE YELLOW 09/26/2018 1605  ? APPEARANCEUR CLOUDY (A) 09/26/2018 1605  ? LABSPEC 1.018 09/26/2018 1605  ? PHURINE 6.0 09/26/2018 1605  ? GLUCOSEU NEGATIVE 09/26/2018 1605  ? HGBUR MODERATE (A) 09/26/2018 1605  ? BILIRUBINUR NEGATIVE 09/26/2018 1605  ? KETONESUR NEGATIVE 09/26/2018 1605  ? PROTEINUR NEGATIVE 09/26/2018 1605  ? UROBILINOGEN 0.2 03/05/2016 1534  ? NITRITE POSITIVE (A) 09/26/2018 1605  ? LEUKOCYTESUR MODERATE (A) 09/26/2018 1605  ? ? ?Radiological Exams on Admission: ?CT Angio Chest PE W and/or Wo Contrast ? ?Result Date: 01/26/2022 ?CLINICAL DATA:  31 year old female with possible pulmonary embolism EXAM: CT ANGIOGRAPHY CHEST WITH CONTRAST TECHNIQUE: Multidetector  CT imaging of the chest was performed using the standard protocol during bolus administration of intravenous contrast. Multiplanar CT image reconstructions and MIPs were obtained to evaluate the vascular anatomy. RADIATION DOSE REDUCTION: This exam was performed according to the departmental dose-optimization program which includes automated exposure control, adjustment of the mA and/or kV according to patient size and/or use of iterative reconstruction technique. CONTRAST:  50mL OMNIPAQUE IOHEXOL 350 MG/ML SOLN COMPARISON:  None. FINDINGS: Cardiovascular: Heart: No cardiomegaly. No pericardial fluid/thickening. No significant coronary calcifications. Aorta: Unremarkable course, caliber, contour of the thoracic aorta. No aneurysm or dissection flap. No periaortic fluid. Pulmonary arteries: No central, lobar, segmental, or proximal subsegmental filling defects. Mediastinum/Nodes: Hiatal hernia. Small lymph nodes adjacent to the GE junction and  distal esophagus with some associated circumferential esophageal thickening. No comparison available. Unremarkable thoracic inlet. Lungs/Pleura: Interlobular septal thickening and thickening of the fissures. Bronchial

## 2022-01-26 NOTE — Consult Note (Signed)
Reason for Consult:pregnancy 12 weeks ?Referring Physician: Chipper HerbZhang MD ? ?[name] [name] is an 31 y.o. female. [id number] ?She presented today to ED with SOB and chest pain and weakness and is being admitted for CHF. No LMP recorded. Patient has had an implant. She states she has not missed menses but pregnancy test is positive and US shows viable 12.1 weeks pregnancy. Nexplanon was placed 2017. She did not have gyn f/u since then. ? ? ?Pertinent Gynecological History: ?Contraception: Nexplanon ?DES exposure: denies ?Blood transfusions: none ?Sexually transmitted diseases: no past history ? ?Menstrual History: ? ?No LMP recorded. Patient has had an implant. ?  ? ?Past Medical History:  ?Diagnosis Date  ? Breech presentation without mention of version, delivered 12/25/2013  ? Medical history non-contributory   ? S/P emergency cesarean section 12/25/2013  ? ? ?Past Surgical History:  ?Procedure Laterality Date  ? CESAREAN SECTION N/A 12/25/2013  ? Procedure: Primary Cesarean Section Delivery Baby Girl @ 0542, Apgars;  Surgeon: Sherron MondayJody Bovard, MD;  Location: [facility];  Service: Obstetrics;  Laterality: N/A;  ? CESAREAN SECTION N/A 12/14/2015  ? Procedure: CESAREAN SECTION;  Surgeon: Adam PhenixJames G Israella Hubert, MD;  Location: [facility];  Service: Obstetrics;  Laterality: N/A;  ? NO PAST SURGERIES    ? ? ?Family History  ?Problem Relation Age of Onset  ? Cancer Neg Hx   ? Diabetes Neg Hx   ? Kidney disease Neg Hx   ? ? ?Social History:  reports that she has never smoked. She has never used smokeless tobacco. She reports that she does not drink alcohol and does not use drugs. ? ?Allergies: No Known Allergies ? ?Medications: I have reviewed the patient's current medications. ? ?Review of Systems  ?Constitutional:  Positive for fatigue.  ?Respiratory:  Positive for shortness of breath.   ?Cardiovascular:  Positive for chest pain.  ?Gastrointestinal:  Negative for abdominal distention and nausea.  ?Genitourinary:  Positive for vaginal bleeding (spotting).   ? ?Blood pressure 117/89, pulse 91, temperature 98.4 ?F (36.9 ?C), temperature source Oral, resp. rate 17, height 5' (1.524 m), weight 77.1 kg, SpO2 99 %. ?Physical Exam ?Vitals and nursing note reviewed.  ?Constitutional:   ?   Appearance: She is well-developed.  ?HENT:  ?   Head: Normocephalic and atraumatic.  ?Cardiovascular:  ?   Rate and Rhythm: Normal rate.  ?Pulmonary:  ?   Effort: Pulmonary effort is normal.  ?Musculoskeletal:     ?   General: Normal range of motion.  ?   Cervical back: Normal range of motion.  ?Skin: ?   General: Skin is warm and dry.  ?Neurological:  ?   Mental Status: She is alert.  ?Psychiatric:     ?   Mood and Affect: Mood normal.     ?   Behavior: Behavior normal.  ? ? ?Results for orders placed or performed during the hospital encounter of 01/26/22 (from the past 48 hour(s))  ?CBC with Differential     Status: Abnormal  ? Collection Time: 01/26/22  8:15 AM  ?Result Value Ref Range  ? WBC 13.5 (H) 4.0 - 10.5 K/uL  ? RBC 4.31 3.87 - 5.11 MIL/uL  ? Hemoglobin 13.5 12.0 - 15.0 g/dL  ? HCT 39.4 36.0 - 46.0 %  ? MCV 91.4 80.0 - 100.0 fL  ? MCH 31.3 26.0 - 34.0 pg  ? MCHC 34.3 30.0 - 36.0 g/dL  ? RDW 12.4 11.5 - 15.5 %  ? Platelets 244 150 - 400 K/uL  ?  nRBC 0.0 0.0 - 0.2 %  ? Neutrophils Relative % 77 %  ? Neutro Abs 10.4 (H) 1.7 - 7.7 K/uL  ? Lymphocytes Relative 15 %  ? Lymphs Abs 2.0 0.7 - 4.0 K/uL  ? Monocytes Relative 5 %  ? Monocytes Absolute 0.7 0.1 - 1.0 K/uL  ? Eosinophils Relative 2 %  ? Eosinophils Absolute 0.2 0.0 - 0.5 K/uL  ? Basophils Relative 0 %  ? Basophils Absolute 0.0 0.0 - 0.1 K/uL  ? Immature Granulocytes 1 %  ? Abs Immature Granulocytes 0.09 (H) 0.00 - 0.07 K/uL  ?  Comment: Performed at [facility], [address], [place], [place] [address]  ?Comprehensive metabolic panel     Status: Abnormal  ? Collection Time: 01/26/22  8:15 AM  ?Result Value Ref Range  ? Sodium 133 (L) 135 - 145 mmol/L  ? Potassium 3.2 (L) 3.5 - 5.1 mmol/L  ? Chloride 103 98 - 111 mmol/L   ? CO2 20 (L) 22 - 32 mmol/L  ? Glucose, Bld 122 (H) 70 - 99 mg/dL  ?  Comment: Glucose reference range applies only to samples taken after fasting for at least 8 hours.  ? BUN 8 6 - 20 mg/dL  ? Creatinine, Ser 0.63 0.44 - 1.00 mg/dL  ? Calcium 8.6 (L) 8.9 - 10.3 mg/dL  ? Total Protein 6.6 6.5 - 8.1 g/dL  ? Albumin 3.2 (L) 3.5 - 5.0 g/dL  ? AST 25 15 - 41 U/L  ? ALT 23 0 - 44 U/L  ? Alkaline Phosphatase 75 38 - 126 U/L  ? Total Bilirubin 0.4 0.3 - 1.2 mg/dL  ? GFR, Estimated >60 >60 mL/min  ?  Comment: (NOTE) ?Calculated using the CKD-EPI Creatinine Equation (2021) ?  ? Anion gap 10 5 - 15  ?  Comment: Performed at [facility], [address], [place], [place] [address]  ?Troponin I (High Sensitivity)     Status: Abnormal  ? Collection Time: 01/26/22  8:15 AM  ?Result Value Ref Range  ? Troponin I (High Sensitivity) 75 (H) <18 ng/L  ?  Comment: (NOTE) ?Elevated high sensitivity troponin I (hsTnI) values and significant  ?changes across serial measurements may suggest ACS but many other  ?chronic and acute conditions are known to elevate hsTnI results.  ?Refer to the "Links" section for chest pain algorithms and additional  ?guidance. ?Performed at [facility], [address] [place], [place] ?[address] ?  ?D-dimer, quantitative     Status: Abnormal  ? Collection Time: 01/26/22  8:15 AM  ?Result Value Ref Range  ? D-Dimer, Quant 1.13 (H) 0.00 - 0.50 ug/mL-FEU  ?  Comment: (NOTE) ?At the manufacturer cut-off value of 0.5 ?g/mL FEU, this assay has a ?negative predictive value of 95-100%.This assay is intended for use ?in conjunction with a clinical pretest probability (PTP) assessment ?model to exclude pulmonary embolism (PE) and deep venous thrombosis ?(DVT) in outpatients suspected of PE or DVT. ?Results should be correlated with clinical presentation. ?Performed at [facility], [address], [place], [place] ?[address] ?  ?hCG, quantitative, pregnancy     Status: Abnormal  ? Collection Time:  01/26/22  8:15 AM  ?Result Value Ref Range  ? hCG, Beta Chain, Quant, S 111,841 (H) <5 mIU/mL  ?  Comment:        ?  GEST. AGE      CONC.  (mIU/mL) ?  <=1 WEEK  5 - 50 ?    2 WEEKS       50 - 500 ?    3 WEEKS       100 - 10,000 ?    4 WEEKS     1,000 - 30,000 ?    5 WEEKS     3,500 - 115,000 ?  6-8 WEEKS     12,000 - 270,000 ?   12 WEEKS     15,000 - 220,000 ?       ?FEMALE AND NON-PREGNANT FEMALE: ?    LESS THAN 5 mIU/mL ?Performed at [facility], [address] [place], [place] [address] ?  ?Brain natriuretic peptide     Status: None  ? Collection Time: 01/26/22  8:15 AM  ?Result Value Ref Range  ? B Natriuretic Peptide 24.8 0.0 - 100.0 pg/mL  ?  Comment: Performed at [facility], [address], [place], [place] [address]  ?Resp Panel by RT-PCR (Flu A&B, Covid) Nasopharyngeal Swab     Status: None  ? Collection Time: 01/26/22  9:32 AM  ? Specimen: Nasopharyngeal Swab; Nasopharyngeal(NP) swabs in vial transport medium  ?Result Value Ref Range  ? SARS Coronavirus 2 by RT PCR NEGATIVE NEGATIVE  ?  Comment: (NOTE) ?SARS-CoV-2 target nucleic acids are NOT DETECTED. ? ?The SARS-CoV-2 RNA is generally detectable in upper respiratory ?specimens during the acute phase of infection. The lowest ?concentration of SARS-CoV-2 viral copies this assay can detect is ?138 copies/mL. A negative result does not preclude SARS-Cov-2 ?infection and should not be used as the sole basis for treatment or ?other patient management decisions. A negative result may occur with  ?improper specimen collection/handling, submission of specimen other ?than nasopharyngeal swab, presence of viral mutation(s) within the ?areas targeted by this assay, and inadequate number of viral ?copies(<138 copies/mL). A negative result must be combined with ?clinical observations, patient history, and epidemiological ?information. The expected result is Negative. ? ?Fact Sheet for Patients:  ?BloggerCourse.com ? ?Fact  Sheet for Healthcare Providers:  ?SeriousBroker.it ? ?This test is no t yet approved or cleared by the [place] and  ?has been authorized for detection and/or diagnos

## 2022-01-26 NOTE — Progress Notes (Signed)
2nd set trop 391, repeat EKG pending, discussed with on-call cardiology Dr. Odis Hollingshead, who ordered continuous Trop trending and TSH. Echo ordered. ?

## 2022-01-27 ENCOUNTER — Inpatient Hospital Stay (HOSPITAL_COMMUNITY): Payer: Medicaid Other

## 2022-01-27 DIAGNOSIS — Z3490 Encounter for supervision of normal pregnancy, unspecified, unspecified trimester: Secondary | ICD-10-CM

## 2022-01-27 DIAGNOSIS — I301 Infective pericarditis: Secondary | ICD-10-CM

## 2022-01-27 DIAGNOSIS — R7989 Other specified abnormal findings of blood chemistry: Secondary | ICD-10-CM

## 2022-01-27 DIAGNOSIS — O0990 Supervision of high risk pregnancy, unspecified, unspecified trimester: Secondary | ICD-10-CM

## 2022-01-27 LAB — BASIC METABOLIC PANEL
Anion gap: 8 (ref 5–15)
BUN: 8 mg/dL (ref 6–20)
CO2: 22 mmol/L (ref 22–32)
Calcium: 8.5 mg/dL — ABNORMAL LOW (ref 8.9–10.3)
Chloride: 104 mmol/L (ref 98–111)
Creatinine, Ser: 0.53 mg/dL (ref 0.44–1.00)
GFR, Estimated: >60 mL/min (ref >60)
Glucose, Bld: 107 mg/dL — ABNORMAL HIGH (ref 70–99)
Potassium: 3.5 mmol/L (ref 3.5–5.1)
Sodium: 134 mmol/L — ABNORMAL LOW (ref 135–145)

## 2022-01-27 MED ORDER — COLCHICINE 0.6 MG PO TABS
0.6000 mg | ORAL_TABLET | Freq: Every day | ORAL | Status: DC
  Administered 2022-01-27: 0.6 mg via ORAL
  Filled 2022-01-27: qty 1

## 2022-01-27 MED ORDER — PREDNISONE 20 MG PO TABS
20.0000 mg | ORAL_TABLET | Freq: Every day | ORAL | Status: DC
Start: 2022-01-27 — End: 2022-01-27
  Administered 2022-01-27: 20 mg via ORAL
  Filled 2022-01-27: qty 1

## 2022-01-27 MED ORDER — COLCHICINE 0.6 MG PO TABS: 0.6000 mg | ORAL_TABLET | Freq: Every day | ORAL | Status: DC

## 2022-01-27 MED ORDER — PREDNISONE 20 MG PO TABS: 20.0000 mg | ORAL_TABLET | Freq: Every day | ORAL | Status: DC

## 2022-01-27 NOTE — Assessment & Plan Note (Addendum)
Patient was admitted to the cardiac unit and placed on a telemetry monitoring. No further chest pain, or dyspnea during her hospitalization.  ? ?Patient tested positive for adenovirus, she has elevated CRP 9.5 , ESR 30 and 38,  ?Positive troponin elevation.  ? ?I spoke with Dr Elgie Congo from [place], at this point considering her pregnancy will avoid Non steroidal anti inflammatory agents.  ?Ok to use prednisone in combination with colchicine if needed. ? ?Case discussed with Dr Terri Skains, patient likely with mild case of myopericarditis. Her symptoms have improved, she has no EKG changes or pericardial fluid. No fever or pericardial rub.  ?Considering her pregnancy, will continue with conservative care. Hold on steroids that can set her up for recurrent disease.  ?Patient will follow up as outpatient. Instructed to return to the ED in case of recurrent symptoms.   ? ?

## 2022-01-27 NOTE — Progress Notes (Addendum)
?Progress Note ? ? ?Patient: [name] [name] MRN:[id number] DOB: 09/28/1991 DOA: 01/26/2022     1 ?DOS: the patient was seen and examined on 01/27/2022 ?  ?Brief hospital course: ?Mrs. [name] [name] was admitted to the hospital with the working diagnosis of new onset acute viral pericarditis.  ? ?31 yo female with the past medical history of obesity class 3, 12.[redacted] weeks pregnant who presented with chest pain and dyspnea. She reported acute onset of chest pain while at rest. Precordial chest pain, non radiating and associated with dyspnea, and lightheadedness, that prompted her to come to the hospital. On her initial physical examination her blood pressure was 114/71, HR 80, RR 21 and 02 saturation 98%, lungs with bilateral rales at bases, heart with S1 and S2 present and rhythmic with no gallops or murmurs, abdomen protuberant, and no lower extremity edema.  ? ?Na 133, K 3,2, CL 103, bicarbonate 20, glucose 122, bun 8 cr 0,63 ?High sensitive troponin 75, 391, 286, 210  ?Wbc 13,5 hgb 13,5 hct 39,4 plt 244  ?Positive pregnancy test  ?Sars covid 19 negative  ?Toxicology screen negative  ?D dimer 1,13  ?Positive rhinovirus.  ? ?Chest radiograph with no cardiomegaly, bilateral interstitial infiltrates bilaterally predominantly at lower lobes.  ?CT chest with bilateral ground glass opacities. Negative for pulmonary embolism.  ? ?EKG 79 bpm, normal axis and normal intervals, sinus rhythm with no significant ST segment or T wave changes.  ? ?Obstetric US with viable pregnancy 12.1 weeks.  ?Patient was placed on furosemide for diuresis.   ? ?Assessment and Plan: ?* Acute viral pericarditis ?Patient tested positive for adenovirus, she has elevated CRP 9.5 , ESR 30 and 38,  ?Positive troponin elevation.  ?Worse pain when patent supine.  ? ?I spoke with Dr [name] [name] from GYN, at this point considering her pregnancy will avoid Non steroidal anti inflammatory agents.  ?Ok to use prednisone in combination with colchicine if needed. ? ?Case  discussed with Dr [name] [name], patient likely with mild case of myopericarditis. Her symptoms have improved, she has no EKG changes or pericardial fluid. No fever. ?Considering her pregnancy, will continue with conservative care. Hold on steroids that can set her up for recurrent disease.  ?Will continue to monitor for the next 24 hrs.  ? ? ?CHF (congestive heart failure) (HCC) ?Echocardiogram with LV systolic function preserved with EF 60 to 65%, RV preserved systolic function. No significant valvular disease.  ? ?Patient with improvement in volume status ?Transitory acute heart failure, diastolic, likely due to viral myopericarditis.  ? ?Hold on furosemide and continue close blood pressure monitoring.  ? ?Low TSH level ?Low TSH, patient with no hyperthyroid symptoms.  ?Check free T3 and T4.  ? ?Pregnancy ?12.[redacted] weeks gestation ?Patient will follow up with [facility] [address]  ?High risk pregnancy.  ? ? ? ? ?  ? ?Subjective: patient with improvement in chest pain, no nausea or vomiting, no lower extremity edema  ? ?Physical Exam: ?Vitals:  ? 01/26/22 2004 01/27/22 0032 01/27/22 0539 01/27/22 0827  ?BP: (!) 104/58 100/64 (!) 107/46 96/65  ?Pulse: 85   71  ?Resp: 17 18 17 18  ?Temp: 98.3 ?F (36.8 ?C) 98 ?F (36.7 ?C) 98 ?F (36.7 ?C) 97.9 ?F (36.6 ?C)  ?TempSrc: Oral Oral Oral Oral  ?SpO2: 100% 98% 98%   ?Weight:   88.6 kg   ?Height:      ? ?Neurology awake and alert ?ENT with no pallor ?Cardiovascular with S1 and S2   present and rhythmic with no gallops or rubs, no murmurs ?No JVD ?No lower extremity edema ?Respiratory with no wheezing or rales ?Abdomen not distended  ?Data Reviewed: ? ? ? ?Family Communication: husband at the bedside but sleeping during my visit.  ? ?Disposition: ?Status is: Inpatient ?Remains inpatient appropriate because: pericarditis.  ? Planned Discharge Destination: Home ? ? ? ?Author: ?[name] Daniel Arrien, MD ?01/27/2022 2:32 PM ? ?For on call review [email or web address].  ?

## 2022-01-27 NOTE — Assessment & Plan Note (Addendum)
12.[redacted] weeks gestation ?Patient will follow up with [facility] [address]  ? ?

## 2022-01-27 NOTE — Assessment & Plan Note (Addendum)
Echocardiogram with LV systolic function preserved with EF 60 to 65%, RV preserved systolic function. No significant valvular disease.  ? ?Patient was placed on IV furosemide with improvement in her symptoms.  ? ?Patient with improvement in volume status ?Transitory acute heart failure, diastolic, likely due to viral myopericarditis.  ? ?At the time of her discharge with no heart failure symptoms.  ?

## 2022-01-27 NOTE — Hospital Course (Addendum)
Mrs. [name] [name] was admitted to the hospital with the working diagnosis of new onset acute viral pericarditis.  ? ?31 yo female with the past medical history of obesity class 3, 12.[redacted] weeks pregnant who presented with chest pain and dyspnea. She reported acute onset of chest pain while at rest. Precordial chest pain, non radiating and associated with dyspnea, and lightheadedness. Apparently she had severe coughing and then brief syncope event. This prompted her husband to take her to the hospital. On her initial physical examination her blood pressure was 114/71, HR 80, RR 21 and 02 saturation 98%, lungs with bilateral rales at bases, heart with S1 and S2 present and rhythmic with no gallops or murmurs, no rubs, abdomen protuberant, and no lower extremity edema.  ? ?Na 133, K 3,2, CL 103, bicarbonate 20, glucose 122, bun 8 cr 0,63 ?High sensitive troponin 75, 391, 286, 210  ?Wbc 13,5 hgb 13,5 hct 39,4 plt 244  ?Positive pregnancy test  ?[name] [name] covid 19 negative  ?Toxicology screen negative  ?D dimer 1,13  ?Positive rhinovirus.  ? ?Chest radiograph with no cardiomegaly, bilateral interstitial infiltrates bilaterally predominantly at lower lobes.  ?CT chest with bilateral ground glass opacities. Negative for pulmonary embolism.  ? ?EKG 79 bpm, normal axis and normal intervals, sinus rhythm with no significant ST segment or T wave changes.  ? ?Obstetric US with viable pregnancy 12.1 weeks.  ?Patient was placed on furosemide for diuresis.   ?

## 2022-01-27 NOTE — Progress Notes (Signed)
BLE venous duplex has been completed. ? ? ?Results can be found under chart review under CV PROC. ?01/27/2022 12:21 PM ?Gabriel Paulding RVT, RDMS ? ?

## 2022-01-27 NOTE — Assessment & Plan Note (Addendum)
Mild low TSH, with normal free t4 ?No hyperthyroid symptoms, likely thyroid function abnormalities, related due to pregnancy. ?Plan to follow up as outpatient.  ?.  ?

## 2022-01-27 NOTE — Progress Notes (Signed)
Progress Note ? ?Patient Name: [name] [name] ?Date of Encounter: 01/27/2022 ? ?Attending physician: Coralie Keens,* ? ?Subjective: ?[name] [name] is a 31 y.o. female who was seen and examined at bedside  ?Denies angina pectoris, shortness of breath, orthopnea, PND. ?Continues to have a nonproductive cough. ?Afebrile ?Sitting upright watching TV and drinking [employer] ?Case discussed and reviewed with her nurse. ? ?Objective: ?Vital Signs in the last 24 hours: ?Temp:  [97.9 ?F (36.6 ?C)-98.3 ?F (36.8 ?C)] 98.2 ?F (36.8 ?C) (03/25 1921) ?Pulse Rate:  [71-91] 91 (03/25 1652) ?Resp:  [17-19] 19 (03/25 1921) ?BP: (96-107)/(46-65) 106/65 (03/25 1921) ?SpO2:  [98 %-100 %] 98 % (03/25 1921) ?Weight:  [88.6 kg] 88.6 kg (03/25 0539) ? ?Intake/Output: ? ?Intake/Output Summary (Last 24 hours) at 01/27/2022 1946 ?Last data filed at 01/27/2022 2426 ?Gross per 24 hour  ?Intake 480 ml  ?Output 1400 ml  ?Net -920 ml  ?  ?Net IO Since Admission: -1,270 mL [01/27/22 1946] ? ?Weights:  ?Filed Weights  ? 01/26/22 0900 01/26/22 1646 01/27/22 0539  ?Weight: 77.1 kg 88.4 kg 88.6 kg  ? ? ?Telemetry: Personally reviewed.  Normal sinus. ? ?Physical examination: ?PHYSICAL EXAM: ?Vital signs: Noted above ?CONSTITUTIONAL: Well-developed and well-nourished. No acute distress.  ?SKIN: Skin is warm and dry. No rash noted. No cyanosis. No pallor. No jaundice ?HEAD: Normocephalic and atraumatic.  ?EYES: No scleral icterus ?MOUTH/THROAT: Moist oral membranes.  ?NECK: No JVD present. No thyromegaly noted. No carotid bruits  ?LYMPHATIC: No visible cervical adenopathy.  ?CHEST Normal respiratory effort. No intercostal retractions  ?LUNGS: Clear to auscultation bilaterally.  No stridor. No wheezes. No rales.  ?CARDIOVASCULAR: Regular rate and rhythm, positive S1-S2, no murmurs rubs or gallops appreciated.  ?ABDOMINAL: Obese, soft, nontender, nondistended, positive bowel sounds in all 4 quadrants, no apparent ascites.  ?EXTREMITIES: No pitting  edema, warm to touch, 2+ bilateral DP and PT pulses ?NEUROLOGIC: Oriented to person, place, and time. Nonfocal. Normal muscle tone.  ?PSYCHIATRIC: Normal mood and affect. Normal behavior. Cooperative ? ?Lab Results: ?Hematology ?Recent Labs  ?Lab 01/26/22 ?0815  ?WBC 13.5*  ?RBC 4.31  ?HGB 13.5  ?HCT 39.4  ?MCV 91.4  ?MCH 31.3  ?MCHC 34.3  ?RDW 12.4  ?PLT 244  ? ? ?Chemistry ?Recent Labs  ?Lab 01/26/22 ?8341 01/27/22 ?0221  ?NA 133* 134*  ?K 3.2* 3.5  ?CL 103 104  ?CO2 20* 22  ?GLUCOSE 122* 107*  ?BUN 8 8  ?CREATININE 0.63 0.53  ?CALCIUM 8.6* 8.5*  ?PROT 6.6  --   ?ALBUMIN 3.2*  --   ?AST 25  --   ?ALT 23  --   ?ALKPHOS 75  --   ?BILITOT 0.4  --   ?GFRNONAA >60 >60  ?ANIONGAP 10 8  ?  ? ?Cardiac Enzymes: ?Cardiac Panel (last 3 results) ?Recent Labs  ?  01/26/22 ?1155 01/26/22 ?1423 01/26/22 ?1606  ?TROPONINIHS 391* 286* 210*  ? ? ?BNP (last 3 results) ?Recent Labs  ?  01/26/22 ?0815  ?BNP 24.8  ? ? ?ProBNP (last 3 results) ?No results for input(s): PROBNP in the last 8760 hours. ? ? ?DDimer  ?Recent Labs  ?Lab 01/26/22 ?0815  ?DDIMER 1.13*  ?  ? ?Hemoglobin A1c:  ?Lab Results  ?Component Value Date  ? HGBA1C 5.0 01/26/2022  ? MPG 96.8 01/26/2022  ? ? ?TSH  ?Recent Labs  ?  01/26/22 ?1606  ?TSH 0.336*  ? ? ?Lipid Panel  ?   ?Component Value Date/Time  ? CHOL 173 01/26/2022 1848  ?  TRIG 42 01/26/2022 1848  ? HDL 74 01/26/2022 1848  ? CHOLHDL 2.3 01/26/2022 1848  ? VLDL 8 01/26/2022 1848  ? LDLCALC 91 01/26/2022 1848  ? ?Results for orders placed or performed during the hospital encounter of 01/26/22  ?Resp Panel by RT-PCR (Flu A&B, Covid) Nasopharyngeal Swab     Status: None  ? Collection Time: 01/26/22  9:32 AM  ? Specimen: Nasopharyngeal Swab; Nasopharyngeal(NP) swabs in vial transport medium  ?Result Value Ref Range Status  ? SARS Coronavirus 2 by RT PCR NEGATIVE NEGATIVE Final  ?  Comment: (NOTE) ?SARS-CoV-2 target nucleic acids are NOT DETECTED. ? ?The SARS-CoV-2 RNA is generally detectable in upper  respiratory ?specimens during the acute phase of infection. The lowest ?concentration of SARS-CoV-2 viral copies this assay can detect is ?138 copies/mL. A negative result does not preclude SARS-Cov-2 ?infection and should not be used as the sole basis for treatment or ?other patient management decisions. A negative result may occur with  ?improper specimen collection/handling, submission of specimen other ?than nasopharyngeal swab, presence of viral mutation(s) within the ?areas targeted by this assay, and inadequate number of viral ?copies(<138 copies/mL). A negative result must be combined with ?clinical observations, patient history, and epidemiological ?information. The expected result is Negative. ? ?Fact Sheet for Patients:  ?BloggerCourse.com ? ?Fact Sheet for Healthcare Providers:  ?SeriousBroker.it ? ?This test is no t yet approved or cleared by the [place] FDA and  ?has been authorized for detection and/or diagnosis of SARS-CoV-2 by ?FDA under an Emergency Use Authorization (EUA). This EUA will remain  ?in effect (meaning this test can be used) for the duration of the ?COVID-19 declaration under Section 564(b)(1) of the Act, 21 ?U.S.C.section 360bbb-3(b)(1), unless the authorization is terminated  ?or revoked sooner.  ? ? ?  ? Influenza A by PCR NEGATIVE NEGATIVE Final  ? Influenza B by PCR NEGATIVE NEGATIVE Final  ?  Comment: (NOTE) ?The Xpert Xpress SARS-CoV-2/FLU/RSV plus assay is intended as an aid ?in the diagnosis of influenza from Nasopharyngeal swab specimens and ?should not be used as a sole basis for treatment. Nasal washings and ?aspirates are unacceptable for Xpert Xpress SARS-CoV-2/FLU/RSV ?testing. ? ?Fact Sheet for Patients: ?BloggerCourse.com ? ?Fact Sheet for Healthcare Providers: ?SeriousBroker.it ? ?This test is not yet approved or cleared by the [place] FDA and ?has been  authorized for detection and/or diagnosis of SARS-CoV-2 by ?FDA under an Emergency Use Authorization (EUA). This EUA will remain ?in effect (meaning this test can be used) for the duration of the ?COVID-19 declaration under Section 564(b)(1) of the Act, 21 U.S.C. ?section 360bbb-3(b)(1), unless the authorization is terminated or ?revoked. ? ?Performed at [facility], [address], [place], [place] ?[address] ?  ?Respiratory (~20 pathogens) panel by PCR     Status: Abnormal  ? Collection Time: 01/26/22  5:54 PM  ? Specimen: Nasopharyngeal Swab; Respiratory  ?Result Value Ref Range Status  ? Adenovirus NOT DETECTED NOT DETECTED Final  ? Coronavirus 229E NOT DETECTED NOT DETECTED Final  ?  Comment: (NOTE) ?The Coronavirus on the Respiratory Panel, DOES NOT test for the novel  ?Coronavirus (2019 nCoV) ?  ? Coronavirus HKU1 NOT DETECTED NOT DETECTED Final  ? Coronavirus NL63 NOT DETECTED NOT DETECTED Final  ? Coronavirus OC43 NOT DETECTED NOT DETECTED Final  ? Metapneumovirus NOT DETECTED NOT DETECTED Final  ? Rhinovirus / Enterovirus DETECTED (A) NOT DETECTED Final  ? Influenza A NOT DETECTED NOT DETECTED Final  ? Influenza B NOT  DETECTED NOT DETECTED Final  ? Parainfluenza Virus 1 NOT DETECTED NOT DETECTED Final  ? Parainfluenza Virus 2 NOT DETECTED NOT DETECTED Final  ? Parainfluenza Virus 3 NOT DETECTED NOT DETECTED Final  ? Parainfluenza Virus 4 NOT DETECTED NOT DETECTED Final  ? Respiratory Syncytial Virus NOT DETECTED NOT DETECTED Final  ? Bordetella pertussis NOT DETECTED NOT DETECTED Final  ? Bordetella Parapertussis NOT DETECTED NOT DETECTED Final  ? Chlamydophila pneumoniae NOT DETECTED NOT DETECTED Final  ? Mycoplasma pneumoniae NOT DETECTED NOT DETECTED Final  ?  Comment: Performed at [facility], [address] [place], [place] [id number]  ? ?Imaging: ?DG Chest 2 View ? ?Result Date: 01/27/2022 ?CLINICAL DATA:  31 year old female with history of congestive heart failure. Shortness of breath.  EXAM: CHEST - 2 VIEW COMPARISON:  Chest x-ray 01/26/2022. FINDINGS: Lung volumes are normal. No consolidative airspace disease. No pleural effusions. No pneumothorax. No pulmonary nodule or mass noted. Pulmonary vasculature

## 2022-01-28 DIAGNOSIS — E876 Hypokalemia: Secondary | ICD-10-CM | POA: Diagnosis present

## 2022-01-28 LAB — BASIC METABOLIC PANEL
Anion gap: 8 (ref 5–15)
BUN: 10 mg/dL (ref 6–20)
CO2: 22 mmol/L (ref 22–32)
Calcium: 8.7 mg/dL — ABNORMAL LOW (ref 8.9–10.3)
Chloride: 103 mmol/L (ref 98–111)
Creatinine, Ser: 0.53 mg/dL (ref 0.44–1.00)
GFR, Estimated: >60 mL/min (ref >60)
Glucose, Bld: 106 mg/dL — ABNORMAL HIGH (ref 70–99)
Potassium: 3.2 mmol/L — ABNORMAL LOW (ref 3.5–5.1)
Sodium: 133 mmol/L — ABNORMAL LOW (ref 135–145)

## 2022-01-28 LAB — T4, FREE: Free T4: 0.85 ng/dL (ref 0.61–1.12)

## 2022-01-28 MED ORDER — PRENATAL MULTIVITAMIN CH: 1.0000 | ORAL_TABLET | Freq: Every day | ORAL | 0 refills | Status: AC

## 2022-01-28 MED ORDER — POTASSIUM CHLORIDE CRYS ER 20 MEQ PO TBCR
40.0000 meq | EXTENDED_RELEASE_TABLET | ORAL | Status: AC
  Administered 2022-01-28 (×2): 40 meq via ORAL
  Filled 2022-01-28 (×2): qty 2

## 2022-01-28 MED ORDER — PRENATAL MULTIVITAMIN CH
1.0000 | ORAL_TABLET | Freq: Every day | ORAL | Status: DC
  Administered 2022-01-28: 1 via ORAL
  Filled 2022-01-28: qty 1

## 2022-01-28 NOTE — Discharge Summary (Signed)
?Physician Discharge Summary ?  ?Patient: [name] [name] MRN: [id number] DOB: December 30, 1990  ?Admit date:     01/26/2022  ?Discharge date: 01/28/22  ?Discharge Physician: Jimmy Picket Vayden Weinand  ? ?PCP: Patient, No Pcp Per (Inactive)  ? ?Recommendations at discharge:  ? ? Patient was placed on pre natal vitamins. ?Considering mild myopericarditis with resolution of symptoms, decision was made to have close follow up, no medical therapy in the setting of early pregnancy.  ?Instructed to return to the ED in case of recurrent symptoms.  ? ?Discharge Diagnoses: ?Principal Problem: ?  Acute viral pericarditis ?Active Problems: ?  CHF (congestive heart failure) ([place]) ?  Low TSH level ?  Pregnancy ?  Hypokalemia ? ?Resolved Problems: ?  * No resolved hospital problems. * ? ?Hospital Course: ?[name] [name] was admitted to the hospital with the working diagnosis of new onset acute viral pericarditis.  ? ?31 yo female with the past medical history of obesity class 3, 12.[redacted] weeks pregnant who presented with chest pain and dyspnea. She reported acute onset of chest pain while at rest. Precordial chest pain, non radiating and associated with dyspnea, and lightheadedness. Apparently she had severe coughing and then brief syncope event. This prompted her husband to take her to the hospital. On her initial physical examination her blood pressure was 114/71, HR 80, RR 21 and 02 saturation 98%, lungs with bilateral rales at bases, heart with S1 and S2 present and rhythmic with no gallops or murmurs, no rubs, abdomen protuberant, and no lower extremity edema.  ? ?Na 133, K 3,2, CL 103, bicarbonate 20, glucose 122, bun 8 cr 0,63 ?High sensitive troponin 75, 391, 286, 210  ?Wbc 13,5 hgb 13,5 hct 39,4 plt 244  ?Positive pregnancy test  ?[facility] covid 19 negative  ?Toxicology screen negative  ?D dimer 1,13  ?Positive rhinovirus.  ? ?Chest radiograph with no cardiomegaly, bilateral interstitial infiltrates bilaterally predominantly at lower lobes.   ?CT chest with bilateral ground glass opacities. Negative for pulmonary embolism.  ? ?EKG 79 bpm, normal axis and normal intervals, sinus rhythm with no significant ST segment or T wave changes.  ? ?Obstetric US with viable pregnancy 12.1 weeks.  ?Patient was placed on furosemide for diuresis.   ? ?Assessment and Plan: ?* Acute viral pericarditis ?Patient was admitted to the cardiac unit and placed on a telemetry monitoring. No further chest pain, or dyspnea during her hospitalization.  ? ?Patient tested positive for adenovirus, she has elevated CRP 9.5 , ESR 30 and 38,  ?Positive troponin elevation.  ? ?I spoke with Dr Elgie Congo from [place], at this point considering her pregnancy will avoid Non steroidal anti inflammatory agents.  ?Ok to use prednisone in combination with colchicine if needed. ? ?Case discussed with Dr Terri Skains, patient likely with mild case of myopericarditis. Her symptoms have improved, she has no EKG changes or pericardial fluid. No fever or pericardial rub.  ?Considering her pregnancy, will continue with conservative care. Hold on steroids that can set her up for recurrent disease.  ?Patient will follow up as outpatient. Instructed to return to the ED in case of recurrent symptoms.   ? ? ?CHF (congestive heart failure) ([place]) ?Echocardiogram with LV systolic function preserved with EF 60 to 65%, RV preserved systolic function. No significant valvular disease.  ? ?Patient was placed on IV furosemide with improvement in her symptoms.  ? ?Patient with improvement in volume status ?Transitory acute heart failure, diastolic, likely due to viral myopericarditis.  ? ?At the time of her  discharge with no heart failure symptoms.  ? ?Low TSH level ?Mild low TSH, with normal free t4 ?No hyperthyroid symptoms, likely thyroid function abnormalities, related due to pregnancy. ?Plan to follow up as outpatient.  ?.  ? ?Pregnancy ?12.[redacted] weeks gestation ?Patient will follow up with [facility] [address]  ? ? ?Hypokalemia ?Hyponatremia.  ?Renal function remained stable, at the time of her discharge her serum cr was 0,53, K 3,2 Na 133 and serum bicarbonate at 22. ? ?She will receive 80 meq Kcl in 2 divided doses before her discharge and plan to follow up electrolytes as outpatient.   ? ? ? ? ?  ? ? ?Consultants: cardiology  ?Procedures performed: none   ?Disposition: Home ?Diet recommendation:  ?Discharge Diet Orders (From admission, onward)  ? ?  Start     Ordered  ? 01/28/22 0000  Diet - low sodium heart healthy       ? 01/28/22 0916  ? ?  ?  ? ?  ? ?Regular diet ?DISCHARGE MEDICATION: ?Allergies as of 01/28/2022   ?No Known Allergies ?  ? ?  ?Medication List  ?  ? ?STOP taking these medications   ? ?albuterol 108 (90 Base) MCG/ACT inhaler ?Commonly known as: VENTOLIN HFA ?  ?amoxicillin-clavulanate 875-125 MG tablet ?Commonly known as: AUGMENTIN ?  ?ibuprofen 600 MG tablet ?Commonly known as: ADVIL ?  ?promethazine-dextromethorphan 6.25-15 MG/5ML syrup ?Commonly known as: PROMETHAZINE-DM ?  ? ?  ? ?TAKE these medications   ? ?prenatal multivitamin Tabs tablet ?Take 1 tablet by mouth daily at 12 noon. ?  ? ?  ? ? Follow-up Information   ? ? [facility] Follow up on 02/15/2022.   ?Why: 3:10 for hospital follow up. ?Contact information: ?[place] ?[place] [address] ?[phone] ? ?  ?  ? ? [facility] Follow up.   ?Specialty: Obstetrics and Gynecology ?Why: Please call for appointment. ?Contact information: ?[address] ?[place] [address] ?[phone] ? ?  ?  ? ?  ?  ? ?  ? ?Discharge Exam: ?Filed Weights  ? 01/26/22 1646 01/27/22 0539 01/28/22 0521  ?Weight: 88.4 kg 88.6 kg 88.8 kg  ? ?BP (!) 95/51 (BP Location: Left Arm)   Pulse 64   Temp 98 ?F (36.7 ?C) (Oral)   Resp 18   Ht $R'4\' 9"'si$  (1.448 m)   Wt 88.8 kg   SpO2 100%   BMI 42.35 kg/m?  ? ?Patient is feeling  better, no chest pain and no dyspnea. ? ?Neurology awake and alert ?ENT with no pallor ?Cardiovascular with S1 and S2 present and rhythmic with no gallops or murmurs, no rubs.  ?No JVD ?No lower extremity edema ?Respiratory with no wheezing ?Abdomen soft and not distended  ? ?Condition at discharge: stable ? ?The results of significant diagnostics from this hospitalization (including imaging, microbiology, ancillary and laboratory) are listed below for reference.  ? ?Imaging Studies: ?DG Chest 2 View ? ?Result Date: 01/27/2022 ?CLINICAL DATA:  31 year old female with history of congestive heart failure. Shortness of breath. EXAM: CHEST - 2 VIEW COMPARISON:  Chest x-ray 01/26/2022. FINDINGS: Lung volumes are normal. No consolidative airspace disease. No pleural effusions. No pneumothorax. No pulmonary nodule or mass noted. Pulmonary vasculature and the cardiomediastinal silhouette are within normal limits. IMPRESSION: No radiographic evidence of acute cardiopulmonary disease. Electronically Signed   By: Vinnie Langton  M.D.   On: 01/27/2022 07:26  ? ?CT Angio Chest PE W and/or Wo Contrast ? ?Result Date: 01/26/2022 ?CLINICAL DATA:  32 year old female with possible pulmonary embolism EXAM: CT ANGIOGRAPHY CHEST WITH CONTRAST TECHNIQUE: Multidetector CT imaging of the chest was performed using the standard protocol during bolus administration of intravenous contrast. Multiplanar CT image reconstructions and MIPs were obtained to evaluate the vascular anatomy. RADIATION DOSE REDUCTION: This exam was performed according to the departmental dose-optimization program which includes automated exposure control, adjustment of the mA and/or kV according to patient size and/or use of iterative reconstruction technique. CONTRAST:  40mL OMNIPAQUE IOHEXOL 350 MG/ML SOLN COMPARISON:  None. FINDINGS: Cardiovascular: Heart: No cardiomegaly. No pericardial fluid/thickening. No significant coronary calcifications. Aorta: Unremarkable  course, caliber, contour of the thoracic aorta. No aneurysm or dissection flap. No periaortic fluid. Pulmonary arteries: No central, lobar, segmental, or proximal subsegmental filling defects. Mediastinum/Nodes: H

## 2022-01-28 NOTE — Assessment & Plan Note (Signed)
Hyponatremia.  ?Renal function remained stable, at the time of her discharge her serum cr was 0,53, K 3,2 Na 133 and serum bicarbonate at 22. ? ?She will receive 80 meq Kcl in 2 divided doses before her discharge and plan to follow up electrolytes as outpatient.   ?

## 2022-01-29 ENCOUNTER — Telehealth: Payer: Self-pay

## 2022-01-29 LAB — T3, FREE: T3, Free: 2.6 pg/mL (ref 2.0–4.4)

## 2022-01-29 NOTE — Telephone Encounter (Signed)
LMOM for patient to call back and schedule appointment.

## 2022-01-30 LAB — COXSACKIE A VIRUS ANTIBODIES
Coxsackie A16 IgG: 1:400 {titer} — ABNORMAL HIGH
Coxsackie A16 IgM: NEGATIVE titer
Coxsackie A24 IgG: 1:1600 {titer} — ABNORMAL HIGH
Coxsackie A24 IgM: NEGATIVE titer
Coxsackie A7 IgG: 1:400 {titer} — ABNORMAL HIGH
Coxsackie A7 IgM: NEGATIVE titer
Coxsackie A9 IgG: 1:400 {titer} — ABNORMAL HIGH
Coxsackie A9 IgM: NEGATIVE titer

## 2022-01-30 LAB — COXSACKIE B VIRUS ANTIBODIES
Coxsackie B1 Ab: NEGATIVE
Coxsackie B2 Ab: NEGATIVE
Coxsackie B3 Ab: NEGATIVE
Coxsackie B4 Ab: NEGATIVE
Coxsackie B5 Ab: NEGATIVE
Coxsackie B6 Ab: NEGATIVE

## 2022-02-13 ENCOUNTER — Telehealth (INDEPENDENT_AMBULATORY_CARE_PROVIDER_SITE_OTHER): Payer: Medicaid Other

## 2022-02-13 DIAGNOSIS — O0990 Supervision of high risk pregnancy, unspecified, unspecified trimester: Secondary | ICD-10-CM

## 2022-02-13 DIAGNOSIS — O09219 Supervision of pregnancy with history of pre-term labor, unspecified trimester: Secondary | ICD-10-CM

## 2022-02-13 DIAGNOSIS — Z3A00 Weeks of gestation of pregnancy not specified: Secondary | ICD-10-CM

## 2022-02-13 MED ORDER — GOJJI WEIGHT SCALE MISC: 1.0000 | Freq: Two times a day (BID) | 0 refills | Status: AC | PRN

## 2022-02-13 MED ORDER — BLOOD PRESSURE KIT DEVI: 1.0000 | 0 refills | Status: AC | PRN

## 2022-02-13 NOTE — Progress Notes (Signed)
New OB Intake ? ?I connected with  [name] [name] on 02/13/22 at  3:15 PM EDT by telephone Video Visit and verified that I am speaking with the correct person using two identifiers. Nurse is located at [facility] and pt is located at home. ? ?I discussed the limitations, risks, security and privacy concerns of performing an evaluation and management service by telephone and the availability of in person appointments. I also discussed with the patient that there may be a patient responsible charge related to this service. The patient expressed understanding and agreed to proceed. ? ?I explained I am completing New OB Intake today. We discussed her EDD of 08/09/2022 that is based ultrasound 01-26-2022. Pt is G5/P4. I reviewed her allergies, medications, Medical/Surgical/OB history, and appropriate screenings. I informed her of [facility] services. Based on history, this is a/an  pregnancy complicated by preterm labor  .  ? ?Patient Active Problem List  ? Diagnosis Date Noted  ? Hypokalemia 01/28/2022  ? Acute viral pericarditis 01/27/2022  ? Low TSH level 01/27/2022  ? Supervision of high risk pregnancy, antepartum 01/27/2022  ? CHF (congestive heart failure) ([facility]) 01/26/2022  ? Near syncope   ? Language barrier, cultural differences 12/14/2015  ? Preterm labor in third trimester with preterm delivery 12/14/2015  ? Malpresentation of fetus 12/14/2015  ? History of preterm delivery, currently pregnant 12/14/2015  ? ? ?Concerns addressed today ?-Patient stated she still has her nexplanon placed in her arm for 6 years, was not able to get in appointment to get it remove. ?-Patient was given [facility] number, they had tried to call patient to schedule an appointment. ? ? ?Delivery Plans:  ?Plans to deliver at [facility].  ? ?MyChart/Babyscripts ?MyChart access verified. I explained pt will have some visits in office and some virtually. Babyscripts instructions given and order placed. Patient verifies receipt of  registration text/e-mail. Account successfully created and app downloaded. ? ?Blood Pressure Cuff  ?Blood pressure cuff ordered for patient to pick-up from [employer]. Explained after first prenatal appt pt will check weekly and document in Babyscripts. ? ?Weight scale: Patient does / does not  have weight scale. Weight scale ordered for patient to pick up from [employer].  ? ?Anatomy US ?Explained first scheduled [place] will be around 19 weeks. Anatomy US scheduled for 03/15/2022 at 09:45AM. Pt notified to arrive at 09:30AM. ?Scheduled AFP lab only appointment if CenteringPregnancy pt for same day as anatomy US.  ? ?Labs ?Discussed Avelina Laine genetic screening with patient. Would like both Panorama and Horizon drawn at new OB visit.Also if interested in genetic testing, tell patient she will need AFP 15-21 weeks to complete genetic testing .Routine prenatal labs needed. ? ?Covid Vaccine ?Patient has not covid vaccine.  ? ?Is patient a CenteringPregnancy candidate? Declined  ?  ?Is patient a Mom+Baby Combined Care candidate? Not a candidate   ?  ?Informed patient of [facility] website  and placed link in her AVS.  ? ?Social Determinants of Health ?Food Insecurity: Patient denies food insecurity. ?WIC Referral: Patient is interested in referral to [facility].  ?Transportation: Patient denies transportation needs. ?Childcare: Discussed no children allowed at ultrasound appointments. Offered childcare services; patient declines childcare services at this time. ? ?Send link to Pregnancy Navigators ? ? ?Placed OB Box on problem list and updated ? ?First visit review ?I reviewed new OB appt with pt. I explained she will have a pelvic exam, ob bloodwork with genetic screening, and PAP smear. Explained pt will  be seen by Casimiro Needle L. Ervin at first visit; encounter routed to appropriate provider. Explained that patient will be seen by pregnancy navigator following visit with provider. [facility] information placed in AVS.  ? ?Vidal Schwalbe, CMA ?02/13/2022  4:09 PM  ?

## 2022-02-14 NOTE — Progress Notes (Deleted)
Patient ID: [name] [name], female   DOB: [birth year], 31 y.o.   MRN: [id number] ? ?

## 2022-02-15 ENCOUNTER — Inpatient Hospital Stay: Payer: Medicaid Other | Admitting: Physician Assistant

## 2022-02-26 ENCOUNTER — Telehealth: Payer: Self-pay | Admitting: Family Medicine

## 2022-02-26 ENCOUNTER — Encounter: Payer: Medicaid Other | Admitting: Obstetrics and Gynecology

## 2022-02-26 NOTE — Telephone Encounter (Signed)
Called patient to inform of appointment change due to provider emergency, there was no answer to the call so a detailed voicemail was left with the call back number for the office.  ?

## 2022-03-15 ENCOUNTER — Ambulatory Visit: Payer: Medicaid Other | Attending: Obstetrics and Gynecology

## 2022-03-15 ENCOUNTER — Ambulatory Visit: Payer: Medicaid Other

## 2022-03-29 ENCOUNTER — Encounter: Payer: Self-pay | Admitting: Family Medicine

## 2022-03-29 ENCOUNTER — Encounter: Payer: Medicaid Other | Admitting: Obstetrics and Gynecology

## 2022-04-21 IMAGING — CR DG CHEST 2V
2 series · 2 of 2 positions shown · non-contrast
Comparison: Chest x-ray 01/26/2022.

CLINICAL DATA: 30-year-old female with history of congestive heart
failure. Shortness of breath.

EXAM:
CHEST - 2 VIEW

[chest pa]
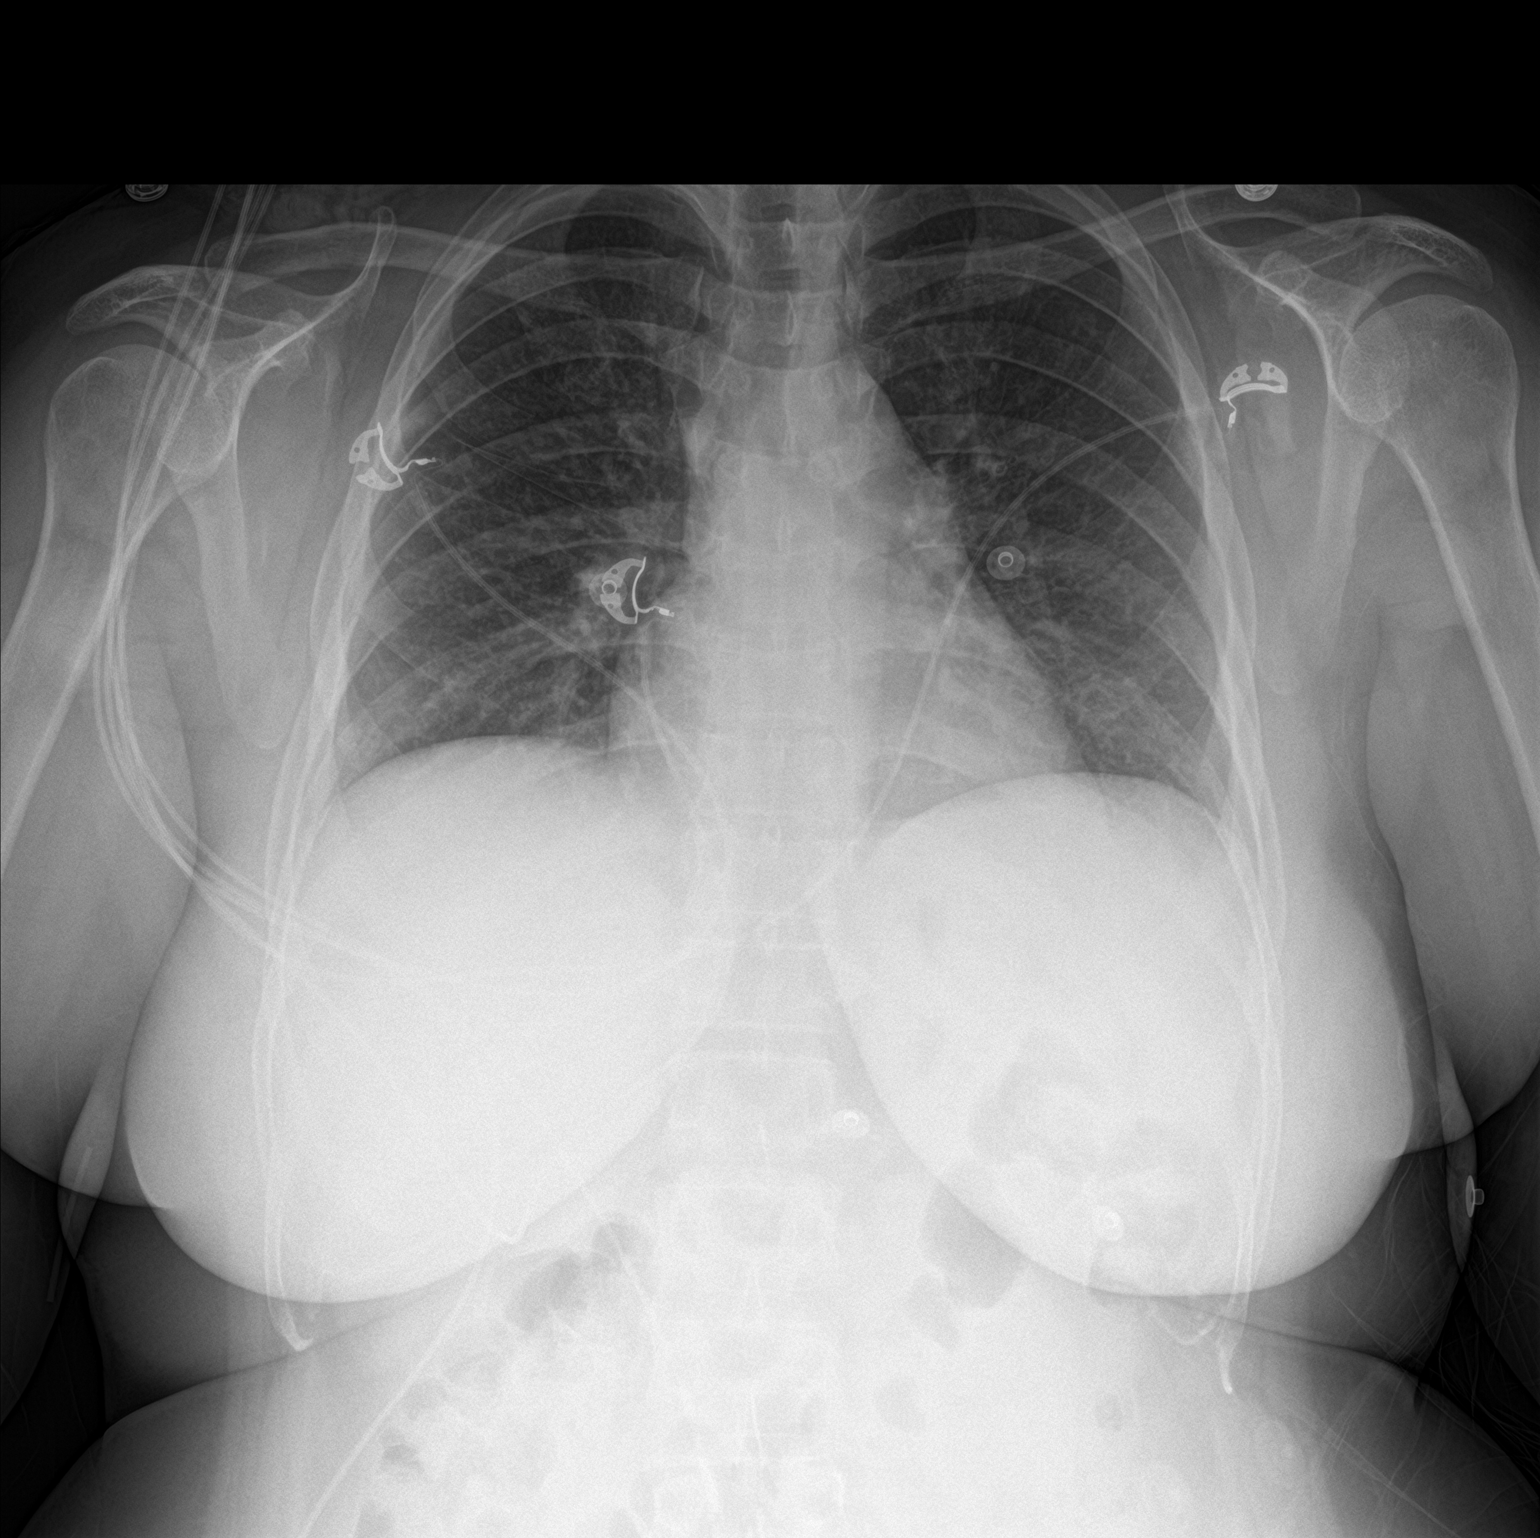

[chest lat]
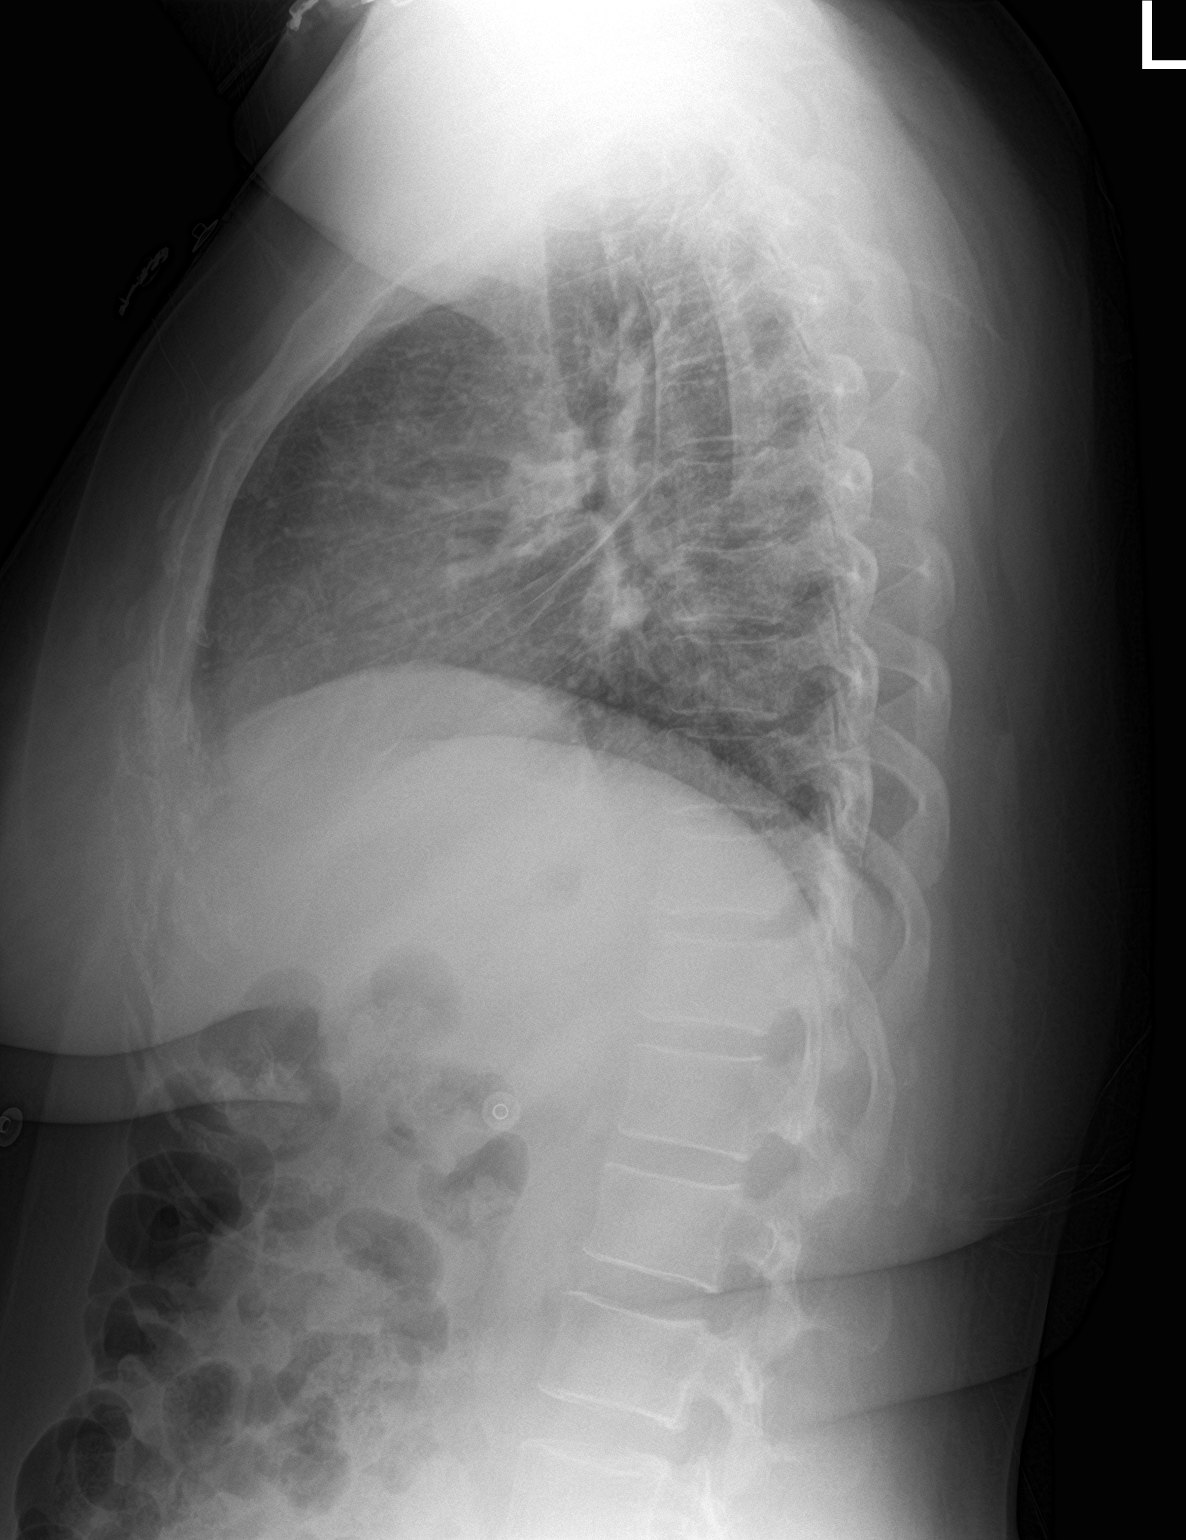

[2 of 2 positions shown; findings below may reference images not displayed]

FINDINGS: Lung volumes are normal. No consolidative airspace disease. No
pleural effusions. No pneumothorax. No pulmonary nodule or mass
noted. Pulmonary vasculature and the cardiomediastinal silhouette
are within normal limits.
IMPRESSION: No radiographic evidence of acute cardiopulmonary disease.
